# Patient Record
Sex: Male | Born: 1970 | Race: White | Hispanic: No | Marital: Single | Smoking: Never smoker
Health system: Southern US, Community
[De-identification: ages and names within clinical notes are randomized; demographics above are authoritative.]

## PROBLEM LIST (undated history)

## (undated) DIAGNOSIS — I10 Essential (primary) hypertension: Secondary | ICD-10-CM

## (undated) DIAGNOSIS — G4730 Sleep apnea, unspecified: Secondary | ICD-10-CM

## (undated) DIAGNOSIS — D561 Beta thalassemia: Secondary | ICD-10-CM

## (undated) DIAGNOSIS — K219 Gastro-esophageal reflux disease without esophagitis: Secondary | ICD-10-CM

## (undated) DIAGNOSIS — J439 Emphysema, unspecified: Secondary | ICD-10-CM

## (undated) DIAGNOSIS — E119 Type 2 diabetes mellitus without complications: Secondary | ICD-10-CM

## (undated) DIAGNOSIS — J159 Unspecified bacterial pneumonia: Secondary | ICD-10-CM

## (undated) DIAGNOSIS — D649 Anemia, unspecified: Secondary | ICD-10-CM

## (undated) DIAGNOSIS — J45909 Unspecified asthma, uncomplicated: Secondary | ICD-10-CM

## (undated) HISTORY — PX: APPENDECTOMY: SHX54

## (undated) HISTORY — PX: UPPER GI ENDOSCOPY: SHX6162

## (undated) HISTORY — PX: OTHER SURGICAL HISTORY: SHX169

---

## 2010-08-16 ENCOUNTER — Emergency Department: Payer: Self-pay | Admitting: Internal Medicine

## 2011-11-27 ENCOUNTER — Ambulatory Visit: Payer: Self-pay | Admitting: Family Medicine

## 2012-01-26 ENCOUNTER — Ambulatory Visit: Payer: Self-pay | Admitting: Gastroenterology

## 2012-02-01 LAB — PATHOLOGY REPORT

## 2012-05-31 DIAGNOSIS — J159 Unspecified bacterial pneumonia: Secondary | ICD-10-CM

## 2012-05-31 HISTORY — DX: Unspecified bacterial pneumonia: J15.9

## 2013-07-23 ENCOUNTER — Inpatient Hospital Stay: Payer: Self-pay | Admitting: Internal Medicine

## 2013-07-23 LAB — BASIC METABOLIC PANEL
Anion Gap: 12 (ref 7–16)
BUN: 13 mg/dL (ref 7–18)
CHLORIDE: 104 mmol/L (ref 98–107)
CREATININE: 1.27 mg/dL (ref 0.60–1.30)
Calcium, Total: 8.6 mg/dL (ref 8.5–10.1)
Co2: 20 mmol/L — ABNORMAL LOW (ref 21–32)
EGFR (Non-African Amer.): >60
Glucose: 161 mg/dL — ABNORMAL HIGH (ref 65–99)
Osmolality: 276 (ref 275–301)
POTASSIUM: 3.1 mmol/L — AB (ref 3.5–5.1)
SODIUM: 136 mmol/L (ref 136–145)

## 2013-07-23 LAB — GLUCOSE, SEROUS FLUID

## 2013-07-23 LAB — BODY FLUID CELL COUNT WITH DIFFERENTIAL
Basophil: 0 %
EOS PCT: 0 %
LYMPHS PCT: 2 %
NEUTROS PCT: 94 %
NUCLEATED CELL COUNT: 153708 /mm3
OTHER MONONUCLEAR CELLS: 4 %
Other Cells BF: 0 %

## 2013-07-23 LAB — LACTATE DEHYDROGENASE, PLEURAL OR PERITONEAL FLUID: LDH, Body Fluid: 1071 U/L

## 2013-07-23 LAB — AMYLASE, BODY FLUID: Amylase, Body Fluid: 17 U/L

## 2013-07-23 LAB — CBC
HCT: 33.1 % — ABNORMAL LOW (ref 40.0–52.0)
HGB: 9.2 g/dL — AB (ref 13.0–18.0)
MCH: 15.9 pg — ABNORMAL LOW (ref 26.0–34.0)
MCHC: 27.7 g/dL — ABNORMAL LOW (ref 32.0–36.0)
MCV: 57 fL — AB (ref 80–100)
PLATELETS: 303 10*3/uL (ref 150–440)
RBC: 5.78 10*6/uL (ref 4.40–5.90)
RDW: 20.8 % — AB (ref 11.5–14.5)
WBC: 15.2 10*3/uL — ABNORMAL HIGH (ref 3.8–10.6)

## 2013-07-23 LAB — TROPONIN I

## 2013-07-23 LAB — ALBUMIN, FLUID (OTHER): Body Fluid Albumin: 2.3 g/dL

## 2013-07-23 LAB — PROTEIN, BODY FLUID: Protein, Body Fluid: 5.8 g/dL

## 2013-07-23 LAB — T4, FREE: FREE THYROXINE: 1.15 ng/dL (ref 0.76–1.46)

## 2013-07-23 LAB — TSH: Thyroid Stimulating Horm: 0.849 u[IU]/mL

## 2013-07-24 LAB — CBC WITH DIFFERENTIAL/PLATELET
Basophil #: 0.1 10*3/uL (ref 0.0–0.1)
Basophil %: 0.3 %
Eosinophil #: 0.1 10*3/uL (ref 0.0–0.7)
Eosinophil %: 0.6 %
HCT: 30.1 % — ABNORMAL LOW (ref 40.0–52.0)
HGB: 8.5 g/dL — ABNORMAL LOW (ref 13.0–18.0)
LYMPHS ABS: 1.1 10*3/uL (ref 1.0–3.6)
Lymphocyte %: 5.5 %
MCH: 16.3 pg — ABNORMAL LOW (ref 26.0–34.0)
MCHC: 28.4 g/dL — ABNORMAL LOW (ref 32.0–36.0)
MCV: 58 fL — ABNORMAL LOW (ref 80–100)
MONOS PCT: 9.1 %
Monocyte #: 1.8 x10 3/mm — ABNORMAL HIGH (ref 0.2–1.0)
NEUTROS PCT: 84.5 %
Neutrophil #: 16.5 10*3/uL — ABNORMAL HIGH (ref 1.4–6.5)
Platelet: 228 10*3/uL (ref 150–440)
RBC: 5.23 10*6/uL (ref 4.40–5.90)
RDW: 20.8 % — AB (ref 11.5–14.5)
WBC: 19.6 10*3/uL — ABNORMAL HIGH (ref 3.8–10.6)

## 2013-07-24 LAB — COMPREHENSIVE METABOLIC PANEL
ALBUMIN: 2 g/dL — AB (ref 3.4–5.0)
ALK PHOS: 54 U/L
AST: 11 U/L — AB (ref 15–37)
Anion Gap: 5 — ABNORMAL LOW (ref 7–16)
BILIRUBIN TOTAL: 0.3 mg/dL (ref 0.2–1.0)
BUN: 9 mg/dL (ref 7–18)
CHLORIDE: 101 mmol/L (ref 98–107)
CO2: 28 mmol/L (ref 21–32)
CREATININE: 0.85 mg/dL (ref 0.60–1.30)
Calcium, Total: 8 mg/dL — ABNORMAL LOW (ref 8.5–10.1)
Glucose: 127 mg/dL — ABNORMAL HIGH (ref 65–99)
Osmolality: 269 (ref 275–301)
POTASSIUM: 4.1 mmol/L (ref 3.5–5.1)
SGPT (ALT): 15 U/L (ref 12–78)
SODIUM: 134 mmol/L — AB (ref 136–145)
Total Protein: 6.4 g/dL (ref 6.4–8.2)

## 2013-07-24 LAB — MAGNESIUM: Magnesium: 1.9 mg/dL

## 2013-07-24 LAB — APTT: ACTIVATED PTT: 44.9 s — AB (ref 23.6–35.9)

## 2013-07-24 LAB — PROTIME-INR
INR: 1.4
Prothrombin Time: 16.6 secs — ABNORMAL HIGH (ref 11.5–14.7)

## 2013-07-25 DIAGNOSIS — I4891 Unspecified atrial fibrillation: Secondary | ICD-10-CM

## 2013-07-25 LAB — CBC WITH DIFFERENTIAL/PLATELET
BASOS PCT: 0.3 %
Basophil #: 0.1 10*3/uL (ref 0.0–0.1)
EOS PCT: 0.4 %
Eosinophil #: 0.1 10*3/uL (ref 0.0–0.7)
HCT: 29.5 % — ABNORMAL LOW (ref 40.0–52.0)
HGB: 8.4 g/dL — ABNORMAL LOW (ref 13.0–18.0)
Lymphocyte #: 1.1 10*3/uL (ref 1.0–3.6)
Lymphocyte %: 5 %
MCH: 16.4 pg — ABNORMAL LOW (ref 26.0–34.0)
MCHC: 28.5 g/dL — AB (ref 32.0–36.0)
MCV: 58 fL — ABNORMAL LOW (ref 80–100)
MONO ABS: 1.4 x10 3/mm — AB (ref 0.2–1.0)
MONOS PCT: 6.1 %
NEUTROS PCT: 88.2 %
Neutrophil #: 19.9 10*3/uL — ABNORMAL HIGH (ref 1.4–6.5)
Platelet: 238 10*3/uL (ref 150–440)
RBC: 5.14 10*6/uL (ref 4.40–5.90)
RDW: 20.6 % — ABNORMAL HIGH (ref 11.5–14.5)
WBC: 22.6 10*3/uL — ABNORMAL HIGH (ref 3.8–10.6)

## 2013-07-26 LAB — BASIC METABOLIC PANEL
Anion Gap: 4 — ABNORMAL LOW (ref 7–16)
BUN: 7 mg/dL (ref 7–18)
CALCIUM: 7.8 mg/dL — AB (ref 8.5–10.1)
CHLORIDE: 102 mmol/L (ref 98–107)
Co2: 27 mmol/L (ref 21–32)
Creatinine: 0.76 mg/dL (ref 0.60–1.30)
EGFR (African American): >60
GLUCOSE: 127 mg/dL — AB (ref 65–99)
OSMOLALITY: 266 (ref 275–301)
Potassium: 4.2 mmol/L (ref 3.5–5.1)
SODIUM: 133 mmol/L — AB (ref 136–145)

## 2013-07-26 LAB — CBC WITH DIFFERENTIAL/PLATELET
Basophil: 1 %
HCT: 31.3 % — ABNORMAL LOW (ref 40.0–52.0)
HGB: 9 g/dL — ABNORMAL LOW (ref 13.0–18.0)
Lymphocytes: 14 %
MCH: 16.5 pg — ABNORMAL LOW (ref 26.0–34.0)
MCHC: 28.7 g/dL — AB (ref 32.0–36.0)
MCV: 58 fL — ABNORMAL LOW (ref 80–100)
Monocytes: 4 %
Platelet: 286 10*3/uL (ref 150–440)
RBC: 5.43 10*6/uL (ref 4.40–5.90)
RDW: 21.1 % — ABNORMAL HIGH (ref 11.5–14.5)
Segmented Neutrophils: 81 %
WBC: 11 10*3/uL — ABNORMAL HIGH (ref 3.8–10.6)

## 2013-07-27 LAB — CREATININE, SERUM
CREATININE: 0.71 mg/dL (ref 0.60–1.30)
EGFR (Non-African Amer.): >60

## 2013-07-27 LAB — RAPID HIV-1/2 QL/CONFIRM: HIV-1/2,Rapid Ql: NEGATIVE

## 2013-07-27 LAB — EXPECTORATED SPUTUM ASSESSMENT W GRAM STAIN, RFLX TO RESP C

## 2013-07-27 LAB — BODY FLUID CULTURE

## 2013-07-28 LAB — CULTURE, BLOOD (SINGLE)

## 2013-07-28 LAB — VANCOMYCIN, TROUGH: Vancomycin, Trough: 9 ug/mL — ABNORMAL LOW (ref 10–20)

## 2013-07-29 LAB — CREATININE, SERUM
Creatinine: 0.68 mg/dL (ref 0.60–1.30)
EGFR (African American): >60
EGFR (Non-African Amer.): >60

## 2013-07-29 LAB — VANCOMYCIN, TROUGH: VANCOMYCIN, TROUGH: 9 ug/mL — AB (ref 10–20)

## 2013-07-30 LAB — VANCOMYCIN, TROUGH: Vancomycin, Trough: 17 ug/mL (ref 10–20)

## 2013-07-30 LAB — CBC WITH DIFFERENTIAL/PLATELET
BASOS ABS: 0 10*3/uL (ref 0.0–0.1)
Basophil %: 0.5 %
Eosinophil #: 0.4 10*3/uL (ref 0.0–0.7)
Eosinophil %: 3.6 %
HCT: 25.6 % — AB (ref 40.0–52.0)
HGB: 7.4 g/dL — ABNORMAL LOW (ref 13.0–18.0)
Lymphocyte #: 1 10*3/uL (ref 1.0–3.6)
Lymphocyte %: 10.1 %
MCH: 16.3 pg — AB (ref 26.0–34.0)
MCHC: 28.8 g/dL — ABNORMAL LOW (ref 32.0–36.0)
MCV: 57 fL — AB (ref 80–100)
MONOS PCT: 9.2 %
Monocyte #: 0.9 x10 3/mm (ref 0.2–1.0)
Neutrophil #: 7.7 10*3/uL — ABNORMAL HIGH (ref 1.4–6.5)
Neutrophil %: 76.6 %
PLATELETS: 285 10*3/uL (ref 150–440)
RBC: 4.54 10*6/uL (ref 4.40–5.90)
RDW: 20.6 % — AB (ref 11.5–14.5)
WBC: 10 10*3/uL (ref 3.8–10.6)

## 2013-07-30 LAB — WOUND CULTURE

## 2013-07-30 LAB — CREATININE, SERUM
Creatinine: 0.64 mg/dL (ref 0.60–1.30)
EGFR (Non-African Amer.): >60

## 2013-07-31 LAB — CREATININE, SERUM
Creatinine: 0.7 mg/dL (ref 0.60–1.30)
EGFR (African American): >60
EGFR (Non-African Amer.): >60

## 2013-07-31 LAB — VANCOMYCIN, TROUGH: Vancomycin, Trough: 22 ug/mL (ref 10–20)

## 2013-07-31 LAB — PATHOLOGY REPORT

## 2013-08-01 LAB — VANCOMYCIN, TROUGH: Vancomycin, Trough: 29 ug/mL (ref 10–20)

## 2013-08-01 LAB — EXPECTORATED SPUTUM ASSESSMENT W REFEX TO RESP CULTURE

## 2013-08-08 ENCOUNTER — Ambulatory Visit: Payer: Self-pay | Admitting: Cardiothoracic Surgery

## 2013-08-09 ENCOUNTER — Ambulatory Visit: Payer: Self-pay | Admitting: Cardiothoracic Surgery

## 2013-08-23 ENCOUNTER — Ambulatory Visit: Payer: Self-pay | Admitting: Cardiothoracic Surgery

## 2013-08-29 ENCOUNTER — Ambulatory Visit: Payer: Self-pay | Admitting: Cardiothoracic Surgery

## 2013-09-20 LAB — CLOSTRIDIUM DIFFICILE(ARMC)

## 2013-09-28 ENCOUNTER — Ambulatory Visit: Payer: Self-pay | Admitting: Cardiothoracic Surgery

## 2014-09-21 NOTE — Consult Note (Signed)
Allergies:  No Known Allergies:   Assessment/Plan:  Assessment/Plan Pt seen in consult for left-sided thyroid nodules. Pt was seen, examined,, and chart reviewed. US neck shows 2 eft-sided nodules 2.6 and 1.5 cm.  A/ Thyroid nodules  P/ Check TSH level Anticipate out-pt follow-up for US guided FNA of the thyroid nodules.  Full consult will be dictated.   Electronic Signatures: Raj JanusSolum, Anna M (MD)  (Signed 23-Feb-15 17:48)  AuthoredForrestine Him: ALLERGIES, Assessment/Plan   Last Updated: 23-Feb-15 17:48 by Raj JanusSolum, Anna M (MD)

## 2014-09-21 NOTE — Consult Note (Signed)
Brief Consult Note: Diagnosis: severe heartburn, abnormal CT with thickened esophagus.   Patient was seen by consultant.   Consult note dictated.   Recommend further assessment or treatment.   Orders entered.   Comments: Please see full GI consult [phone]#[id number].  Patient admitted with pneumonia, found with abnormal ct showing thickened esophagus.  CT reviewed, however appearance more c/w severe esophagitis than mass.  Long h/o GERD with irregular treatment. Currently due to pulmonary status, not a candidate for sedated luminal evaluation.  However will change ppi to iv bid, also will need to get stool for c+s/c.diff due to c/o diarrhea and  recent abx history.  Following.  Electronic Signatures: Barnetta ChapelSkulskie, Martin (MD)  (Signed 23-Feb-15 21:37)  Authored: Brief Consult Note   Last Updated: 23-Feb-15 21:37 by Barnetta ChapelSkulskie, Martin (MD)

## 2014-09-21 NOTE — Consult Note (Signed)
Brief Consult Note: Diagnosis: left pleural effusion.   Patient was seen by consultant.   Consult note dictated.   Comments: Discussed care with Drs. Kerby LessPatel, Kasa, Archer AsaMcCullough.  Loculated pleural effusion.  LLL pneumonia, esophageal thickening, marked mediastinal adenopathy, thyroid mass, microcytic anemia.  Recommend CT guided pigtail with TPA for pleural effusion.  Discussed option of VATS or thoracotomy as well.  Will try to manage nonoperatively.  Electronic Signatures: Jasmine Decemberaks, Deziah Renwick E (MD)  (Signed 24-Feb-15 13:19)  Authored: Brief Consult Note   Last Updated: 24-Feb-15 13:19 by Jasmine Decemberaks, Tabby Beaston E (MD)

## 2014-09-21 NOTE — Consult Note (Signed)
Chief Complaint:  Subjective/Chief Complaint Coverng for Dr. Marva PandaSkulskie. No dysphagia. No heartburn now- on protonix. Eating well. Has chest tube in place.   VITAL SIGNS/ANCILLARY NOTES: **Vital Signs.:   28-Feb-15 05:14  Vital Signs Type Routine  Temperature Temperature (F) 99.3  Celsius 37.3  Temperature Source oral  Pulse Pulse 105  Respirations Respirations 18  Systolic BP Systolic BP 117  Diastolic BP (mmHg) Diastolic BP (mmHg) 76  Mean BP 89  Pulse Ox % Pulse Ox % 95  Pulse Ox Activity Level  At rest  Oxygen Delivery 2L   Brief Assessment:  GEN no acute distress   Cardiac Regular   Respiratory clear BS  left chest tube   Gastrointestinal Normal   Lab Results: TDMs:  28-Feb-15 23:52   Vancomycin, Trough LAB  9 (Result(s) reported on 28 Jul 2013 at 12:37AM.)   Assessment/Plan:  Assessment/Plan:  Assessment Pneumonia/empyema. Thickened esophagus. Prob esophagitis.   Plan Continue protonix. Can give po bid. Dr. Marva PandaSkulskie will check on patient Monday.   Electronic Signatures: Lutricia Feilh, Yui Mulvaney (MD)  (Signed [phone]-Feb-15 10:12)  Authored: Chief Complaint, VITAL SIGNS/ANCILLARY NOTES, Brief Assessment, Lab Results, Assessment/Plan   Last Updated: 28-Feb-15 10:12 by Lutricia Feilh, Yoali Conry (MD)

## 2014-09-21 NOTE — Consult Note (Signed)
Chief Complaint:  Subjective/Chief Complaint seen for abnormal chest CT/thickened esophagus. one episode of heartburn since yesterday   VITAL SIGNS/ANCILLARY NOTES: **Vital Signs.:   24-Feb-15 17:13  Temperature Source oral  Pulse Pulse 118  Pulse source if not from Vital Sign Device ST  Respirations Respirations 39  Systolic BP Systolic BP 347  Diastolic BP (mmHg) Diastolic BP (mmHg) 83  Mean BP 101  Pulse Ox % Pulse Ox % 91  Pulse Ox Activity Level  At rest  Oxygen Delivery 3L; Nasal Cannula  Pulse Ox Heart Rate 118  Telemetry pattern Cardiac Rhythm Atrial fibrillation; pattern reported by Telemetry Clerk    17:30  Vital Signs Type Routine  Temperature Temperature (F) 102.8  Celsius 39.3  Temperature Source oral  Pulse Pulse 114  Pulse source if not from Vital Sign Device ST  Respirations Respirations 41  Systolic BP Systolic BP 90  Diastolic BP (mmHg) Diastolic BP (mmHg) 81  Mean BP 84  Pulse Ox % Pulse Ox % 92  Pulse Ox Activity Level  At rest  Oxygen Delivery 3L; Nasal Cannula  Pulse Ox Heart Rate 116  Telemetry pattern Cardiac Rhythm Atrial fibrillation; pattern reported by Telemetry Clerk   Brief Assessment:  Cardiac tachycardia   Respiratory clear BS  more somewhat less on left   Gastrointestinal details normal Soft  Nontender  Nondistended  Bowel sounds normal   Lab Results: Hepatic:  24-Feb-15 04:51   Bilirubin, Total 0.3  Alkaline Phosphatase 54 (45-117 NOTE: New Reference Range 04/20/13)  SGPT (ALT) 15  SGOT (AST)  11  Total Protein, Serum 6.4  Albumin, Serum  2.0  Routine BB:  24-Feb-15 12:17   ABO Group + Rh Type O Positive  Antibody Screen NEGATIVE (Result(s) reported on 24 Jul 2013 at 01:30PM.)  Crossmatch Unit 1 Ready  Crossmatch Unit 2 Ready  Crossmatch Unit 3 Ready  Crossmatch Unit 4 Ready (Result(s) reported on 24 Jul 2013 at 02:07PM.)  Cardiology:  24-Feb-15 12:05   Ventricular Rate 199  Atrial Rate 136  QRS Duration 68  QT 220   QTc 400  R Axis 3  T Axis 66  ECG interpretation Atrial fibrillation with rapid ventricular response with premature ventricular or aberrantly conducted complexes Nonspecific ST and T wave abnormality , probably digitalis effect Abnormal ECG When compared with ECG of 23-Jul-2013 02:08, Atrial fibrillation has replaced Sinus rhythm Vent. rate has increased BY 100 BPM Nonspecific T wave abnormality now evident in Inferior leads Confirmed by MORAYATI, SAM ([id number]) on 07/24/2013 4:02:25 PM  Overreader: Hipolito Bayley  Routine Chem:  24-Feb-15 04:51   Result Comment HGB/HCT - RESULTS VERIFIED BY REPEAT TESTING.  Result(s) reported on 24 Jul 2013 at 05:26AM.  Glucose, Serum  127  BUN 9  Creatinine (comp) 0.85  Sodium, Serum  134  Potassium, Serum 4.1  Chloride, Serum 101  CO2, Serum 28  Calcium (Total), Serum  8.0  Osmolality (calc) 269  eGFR (African American) >60  eGFR (Non-African American) >60 (eGFR values <24m/min/1.73 m2 may be an indication of chronic kidney disease (CKD). Calculated eGFR is useful in patients with stable renal function. The eGFR calculation will not be reliable in acutely ill patients when serum creatinine is changing rapidly. It is not useful in  patients on dialysis. The eGFR calculation may not be applicable to patients at the low and high extremes of body sizes, pregnant women, and vegetarians.)  Anion Gap  5  Magnesium, Serum 1.9 (1.8-2.4 THERAPEUTIC RANGE: 4-7 mg/dL TOXIC: > 10  mg/dL  -----------------------)  Routine Coag:  24-Feb-15 12:17   Prothrombin  16.6  INR 1.4 (INR reference interval applies to patients on anticoagulant therapy. A single INR therapeutic range for coumarins is not optimal for all indications; however, the suggested range for most indications is 2.0 - 3.0. Exceptions to the INR Reference Range may include: Prosthetic heart valves, acute myocardial infarction, prevention of myocardial infarction, and combinations of aspirin  and anticoagulant. The need for a higher or lower target INR must be assessed individually. Reference: The Pharmacology and Management of the Vitamin K  antagonists: the seventh ACCP Conference on Antithrombotic and Thrombolytic Therapy. XVQMG.8676 Sept:126 (3suppl): N9146842. A HCT value >55% may artifactually increase the PT.  In one study,  the increase was an average of 25%. Reference:  "Effect on Routine and Special Coagulation Testing Values of Citrate Anticoagulant Adjustment in Patients with High HCT Values." American Journal of Clinical Pathology 2006;126:400-405.)  Activated PTT (APTT)  44.9 (A HCT value >55% may artifactually increase the APTT. In one study, the increase was an average of 19%. Reference: "Effect on Routine and Special Coagulation Testing Values of Citrate Anticoagulant Adjustment in Patients with High HCT Values." American Journal of Clinical Pathology 2006;126:400-405.)  Routine Hem:  23-Feb-15 02:05   WBC (CBC)  15.2  24-Feb-15 04:51   WBC (CBC)  19.6  RBC (CBC) 5.23  Hemoglobin (CBC)  8.5  Hematocrit (CBC)  30.1  Platelet Count (CBC) 228  MCV  58  MCH  16.3  MCHC  28.4  RDW  20.8  Neutrophil % 84.5  Lymphocyte % 5.5  Monocyte % 9.1  Eosinophil % 0.6  Basophil % 0.3  Neutrophil #  16.5  Lymphocyte # 1.1  Monocyte #  1.8  Eosinophil # 0.1  Basophil # 0.1   Radiology Results: XRay:    24-Feb-15 17:00, Chest 1 View AP or PA  Chest 1 View AP or PA   REASON FOR EXAM:    post chest tube insertion  COMMENTS:       PROCEDURE: DXR - DXR CHEST 1 VIEWAP OR PA  - Jul 24 2013  5:00PM     CLINICAL DATA:  Post chest tube insertion    EXAM:  CHEST - 1 VIEW    COMPARISON:  07/23/2013    FINDINGS:  Cardiomegaly again noted. Right lung is clear. A pleural catheter is  noted in left upper hemithorax. Small residual partially loculated  left pleural effusion with left basilar atelectasis or infiltrate.  No diagnostic pneumothorax. Some atelectasis  or scarring is noted in  left apex.     IMPRESSION:  Left pleural catheter is noted in upper hemithorax. No diagnostic  pneumothorax. Small partially loculated left pleural effusion with  left basilar atelectasis or infiltrate.      Electronically Signed    PP:JKDTO  Pop M.D.    On: 07/24/2013 17:04       Verified By: Ephraim Hamburger, M.D.,  Cardiology:    24-Feb-15 12:05, ECG  ECG interpretation   Atrial fibrillation with rapid ventricular response with premature ventricular or aberrantly conducted complexes  Nonspecific ST and T wave abnormality , probably digitalis effect  Abnormal ECG  When compared with ECG of 23-Jul-2013 02:08,  Atrial fibrillation has replaced Sinus rhythm  Vent. rate has increased BY 100 BPM  Nonspecific T wave abnormality now evident in Inferior leads  Confirmed by MORAYATI, SAM ([id number]) on 07/24/2013 4:02:25 PM    Overreader: Hipolito Bayley   Assessment/Plan:  Assessment/Plan:  Assessment 1) abnormal ct with much of the esophagus from the mid to bottom thickened.  esophagitis vs neoplasia (less likely) 2) left pleural effusion/recent pna-management per Dr Faith Rogue. 3) new AF-converted spontaneously, stable.   Plan 1) continue iv bid ppi. 2) egd when clinically feasible.  If patient goes to OR thursday pm, an be done prior to extubation in the OR.  Otherwise will need to wait until clinical improvement otherwise.  discussed with Dr Faith Rogue.   Electronic Signatures: Loistine Simas (MD)  (Signed 24-Feb-15 18:05)  Authored: Chief Complaint, VITAL SIGNS/ANCILLARY NOTES, Brief Assessment, Lab Results, Radiology Results, Assessment/Plan   Last Updated: 24-Feb-15 18:05 by Loistine Simas (MD)

## 2014-09-21 NOTE — Consult Note (Signed)
Chief Complaint:  Subjective/Chief Complaint seen for abnormal ct, thickened esophagus.  patietn doing well on bid ppi.  no n/v no dysphagia, no abd pain.  tolerating regualr diet.  No constipation currently not taking miralax.   VITAL SIGNS/ANCILLARY NOTES: **Vital Signs.:   05-Mar-15 05:33  Temperature Temperature (F) 98.1  Celsius 36.7  Temperature Source oral  Pulse Pulse 84  Respirations Respirations 18  Systolic BP Systolic BP 113  Diastolic BP (mmHg) Diastolic BP (mmHg) 79  Mean BP 90  Pulse Ox % Pulse Ox % 92  Pulse Ox Activity Level  At rest  Oxygen Delivery Room Air/ 21 %   Brief Assessment:  Cardiac Regular   Respiratory clear BS  some decrease left compared to right.   Gastrointestinal details normal Soft  Nontender  Nondistended  Bowel sounds normal   Lab Results: TDMs:  04-Mar-15 15:40   Vancomycin, Trough LAB  29  Routine Chem:  04-Mar-15 15:40   Result Comment vancomycin - RESULTS VERIFIED BY REPEAT TESTING.  - NOTIFIED OF CRITICAL VALUE  - c/t jason robbins 1629 08-01-13 gas  - READ-BACK PROCESS PERFORMED.  Result(s) reported on 01 Aug 2013 at 04:33PM.  Routine Hem:  02-Mar-15 02:59   MCV  57   Radiology Results: XRay:    04-Mar-15 08:46, Chest PA and Lateral  Chest PA and Lateral   REASON FOR EXAM:    chest tubes to open drainage.  Please do inspiratory   film  COMMENTS:       PROCEDURE: DXR - DXR CHEST PA (OR AP) AND LATERAL  - Aug 01 2013  8:46AM     CLINICAL DATA:  Chest tube drainage.    EXAM:  CHEST  2 VIEW    COMPARISON:Same day.    FINDINGS:  Three left-sided chest tubes are again noted and unchanged in  position. No pneumothorax is noted. Left basilar opacity is again  noted consistent with subsegmental atelectasis or scarring. Right  lung is clear. Right-sided PICC line is unchanged in position.  Stable elevation of left hemidiaphragm. Minimally displaced left 6  rib fracture is again noted.     IMPRESSION:  Left-sided chest  tubes are unchanged in position. No pneumothorax is  noted. No significant change compared to prior exam.      Electronically Signed    By: Roque LiasJames  Green M.D.    On: 08/01/2013 08:54     Verified By: Maryagnes AmosJAMES J. GREEN, M.D.,   Assessment/Plan:  Assessment/Plan:  Assessment 1)  GERD-symptoms improved on bid protonix. 2) abnormal CT, thickened esophagus, as noted  3) PNA/pleural effusion, s/p drainage/chesrt tubes and decortication-stable.   Plan 1) EGD today.  I have discussed the risks benefits and complications of egd to include not limited to bleeding infection perforation and sedation and he wishes to proceed.  further recs to follow.   Electronic Signatures: Barnetta ChapelSkulskie, Breena Bevacqua (MD)  (Signed 05-Mar-15 11:05)  Authored: Chief Complaint, VITAL SIGNS/ANCILLARY NOTES, Brief Assessment, Lab Results, Radiology Results, Assessment/Plan   Last Updated: 05-Mar-15 11:05 by Barnetta ChapelSkulskie, Delaine Hernandez (MD)

## 2014-09-21 NOTE — Consult Note (Signed)
Chief Complaint:  Subjective/Chief Complaint seen for gerd and abnormal CT scan.  denies n/v dysphagia or abdominal pain. toleratign regular diet very well.   VITAL SIGNS/ANCILLARY NOTES: **Vital Signs.:   04-Mar-15 05:24  Temperature Temperature (F) 97.9  Celsius 36.6  Temperature Source oral  Pulse Pulse 94  Respirations Respirations 18  Systolic BP Systolic BP 673  Diastolic BP (mmHg) Diastolic BP (mmHg) 83  Mean BP 95  Pulse Ox % Pulse Ox % 93  Pulse Ox Activity Level  At rest  Oxygen Delivery Room Air/ 21 %   Brief Assessment:  GEN well developed   Cardiac Regular   Respiratory clear BS   Gastrointestinal details normal Soft  Nontender  Nondistended  No masses palpable  Bowel sounds normal   Lab Results: TDMs:  03-Mar-15 05:27   Vancomycin, Trough LAB  22  Routine Chem:  03-Mar-15 05:27   Result Comment LABS - This specimen was collected through an   - indwelling catheter or arterial line.  - A minimum of 32ms of blood was wasted prior    - to collecting the sample.  Interpret  - results with caution. VANCOMYCIN - RESULTS VERIFIED BY REPEAT TESTING.  - NOTIFIED OF CRITICAL VALUE  - C/ NIKOLA [id number]  07-31-13. [facility]  - READ-BACK PROCESS PERFORMED.  Result(s) reported on 31 Jul 2013 at 06:41AM.  Creatinine (comp) 0.70  eGFR (African American) >60  eGFR (Non-African American) >60 (eGFR values <677mmin/1.73 m2 may be an indication of chronic kidney disease (CKD). Calculated eGFR is useful in patients with stable renal function. The eGFR calculation will not be reliable in acutely ill patients when serum creatinine is changing rapidly. It is not useful in  patients on dialysis. The eGFR calculation may not be applicable to patients at the low and high extremes of body sizes, pregnant women, and vegetarians.)   Assessment/Plan:  Assessment/Plan:  Assessment 1) abnormal CT with thickened esophagus.  patient currently asymptomatic, taking bid ppi regularly.   I did recommend a barium swallow, however radiology feels the films will not be optimal due to  changes in the left lung and chest tubes.  alternative is to do egd with anesthesia assistance for further evaluation.  It will be 6 weeks before the chest tubes are removed.  case discussed with Dr OaFaith Roguend Dr KoVianne Bulls  Plan 1) egd tomorrow.  I have discussed the risks benefits and complications of egd to include not limited to bleeding infection perforation and sedation and he wishes to proceed. further recs to follow.   Electronic Signatures: SkLoistine SimasMD)  (Signed 04-Mar-15 12:26)  Authored: Chief Complaint, VITAL SIGNS/ANCILLARY NOTES, Brief Assessment, Lab Results, Assessment/Plan   Last Updated: 04-Mar-15 12:26 by SkLoistine SimasMD)

## 2014-09-21 NOTE — Consult Note (Signed)
Chief Complaint:  Subjective/Chief Complaint seen for abnormal ct, GERD.  patietn denies reflux or heartburn sx today, tolerating regular diet, denies abdominal pain or dysphagia.   VITAL SIGNS/ANCILLARY NOTES: **Vital Signs.:   25-Feb-15 07:00  Vital Signs Type Routine  Temperature Temperature (F) 97.6  Celsius 36.4  Temperature Source oral  Pulse Pulse 92  Pulse source if not from Vital Sign Device per cardiac monitor  Respirations Respirations 32  Systolic BP Systolic BP 126  Diastolic BP (mmHg) Diastolic BP (mmHg) 69  Mean BP 88  Pulse Ox % Pulse Ox % 97  Oxygen Delivery 5L; Nasal Cannula  Pulse Ox Heart Rate 92    12:00  Vital Signs Type Routine  Pulse Pulse 100  Pulse source if not from Vital Sign Device per cardiac monitor  Respirations Respirations 32  Systolic BP Systolic BP 101  Diastolic BP (mmHg) Diastolic BP (mmHg) 73  Mean BP 82  Pulse Ox % Pulse Ox % 94  Oxygen Delivery 5L; Nasal Cannula  Pulse Ox Heart Rate 100   Brief Assessment:  Cardiac Regular   Respiratory normal resp effort  clear right, clear left   Gastrointestinal details normal Soft  Nontender  Nondistended  Bowel sounds normal   Lab Results: Routine Coag:  24-Feb-15 12:17   INR 1.4 (INR reference interval applies to patients on anticoagulant therapy. A single INR therapeutic range for coumarins is not optimal for all indications; however, the suggested range for most indications is 2.0 - 3.0. Exceptions to the INR Reference Range may include: Prosthetic heart valves, acute myocardial infarction, prevention of myocardial infarction, and combinations of aspirin and anticoagulant. The need for a higher or lower target INR must be assessed individually. Reference: The Pharmacology and Management of the Vitamin K  antagonists: the seventh ACCP Conference on Antithrombotic and Thrombolytic Therapy. Chest.2004 Sept:126 (3suppl): L7870634. A HCT value >55% may artifactually increase the PT.   In one study,  the increase was an average of 25%. Reference:  "Effect on Routine and Special Coagulation Testing Values of Citrate Anticoagulant Adjustment in Patients with High HCT Values." American Journal of Clinical Pathology 2006;126:400-405.)  Routine Hem:  24-Feb-15 04:51   WBC (CBC)  19.6  Platelet Count (CBC) 228  25-Feb-15 07:26   WBC (CBC)  22.6  RBC (CBC) 5.14  Hemoglobin (CBC)  8.4  Hematocrit (CBC)  29.5  Platelet Count (CBC) 238  MCV  58  MCH  16.4  MCHC  28.5  RDW  20.6  Neutrophil % 88.2  Lymphocyte % 5.0  Monocyte % 6.1  Eosinophil % 0.4  Basophil % 0.3  Neutrophil #  19.9  Lymphocyte # 1.1  Monocyte #  1.4  Eosinophil # 0.1  Basophil # 0.1 (Result(s) reported on 25 Jul 2013 at 07:45AM.)   Radiology Results: XRay:    25-Feb-15 06:17, Chest Portable Single View  Chest Portable Single View   REASON FOR EXAM:    assess for pleural effusion  COMMENTS:       PROCEDURE: DXR - DXR PORTABLE CHEST SINGLE VIEW  - Jul 25 2013  6:17AM     CLINICAL DATA:  Pleural effusion.    EXAM:  PORTABLE CHEST - 1 VIEW    COMPARISON:  DG CHEST 1V PORT dated 07/24/2013    FINDINGS:  Mediastinum and hilar structures normal. Persistent cardiomegaly is  present with mild pulmonary vascular prominence. Diffuse left lung  infiltrate has partially cleared. Left pleural effusion remains. No  pneumothorax. No acute osseous abnormality.  IMPRESSION:  1. Partial clearing of left lung infiltrate and/or atelectasis.  2. Persistent left pleural effusion.  3. Persistent severe cardiomegaly with mild pulmonary vascular  prominence .      Electronically Signed    By: Maisie Fushomas  Register    On: 07/25/2013 07:40       Verified By: Gwynn BurlyHOMAS E. REGISTER, M.D., MD   Assessment/Plan:  Assessment/Plan:  Assessment 1) abnormal ct with apparent thickened esophagus.  no dysphagia, tolerating regular diet, no heartburn curently on bid iv ppi.  2)  PNA, left empyema/effusion-improving   3) cardiomegally   Plan 1)  continue bid ppi.  needs to continue bid protonix as outpatient. Possible EGD tomorrow if he is intubated for OR proceedure.  Otherwise will need to await further improvement of pulmonary status to do as outpatient in a couple weeks.   Electronic Signatures: Barnetta ChapelSkulskie, Martin (MD)  (Signed [phone]-Feb-15 13:22)  Authored: Chief Complaint, VITAL SIGNS/ANCILLARY NOTES, Brief Assessment, Lab Results, Radiology Results, Assessment/Plan   Last Updated: 25-Feb-15 13:22 by Barnetta ChapelSkulskie, Martin (MD)

## 2014-09-21 NOTE — Consult Note (Signed)
PATIENT NAME:  Evan, Edwards MR#:  [id number] DATE OF BIRTH:  1970-12-12  DATE OF CONSULTATION:  07/27/2013  REFERRING PHYSICIAN:  Sona A. Allena Katz, MD CONSULTING PHYSICIAN:  Stann Mainland. Sampson Goon, MD  REASON FOR CONSULTATION: Empyema.   HISTORY OF PRESENT ILLNESS: This is a previously healthy 44 year old gentleman who works as a Naval architect, who was admitted with SIRS and left lower lobe pneumonia as well as a complicated pleural effusion that ended up being an empyema. The patient describes that about 1 month ago, he was traveling in [place], had a bad pneumonia, was admitted to the hospital and treated with antibiotics. He improved, was discharged home and started on levofloxacin. However, he then developed worsening chest pain, cough and fevers. He was admitted and underwent a left-sided thoracentesis on the 23rd. He then had pigtail catheter placement on February 24. He has been receiving tPA through the pigtail catheter. Followup CT does show loculations, and so he underwent open thoracotomy with a chest tube placement and decortication on February 26. Cultures of the fluid are negative so far. Cultures of the sputum are growing Serratia. Blood cultures are negative.   The patient reports he has never had pneumonia before. Prior to that most recent pneumonia, he was doing relatively well. He reports having an HIV test 4 to 5 years ago.   PAST MEDICAL HISTORY: None.   PAST SURGICAL HISTORY: Appendectomy.   ALLERGIES: No known drug allergies.   SOCIAL HISTORY: He is a Naval architect. He lives at home alone. Does not smoke. He is single. He drinks alcohol only very occasionally. He does not use street drugs.   FAMILY HISTORY: Positive for coronary artery disease.   MEDICATIONS: No home medications.  ANTIBIOTICS SINCE ADMISSION: Include meropenem and vancomycin.   REVIEW OF SYSTEMS: Eleven systems are negative except as per HPI.   PHYSICAL EXAMINATION:  VITAL SIGNS: Temperature  98.2, pulse 96, blood pressure 132/78, respirations 24, sat 98% on 5 liters.  GENERAL: He is interactive but somewhat agitated and speaks in a very loud voice and is somewhat irritated.  HEENT: Pupils equal, round and reactive to light and accommodation. Extraocular movements are intact. Sclerae are anicteric. Oropharynx is clear.  NECK: Supple.  HEART: Regular.  LUNGS: He has coarse breath sounds on the left. There is a chest tube draining serosanguineous fluid.  ABDOMEN: Soft, nontender, nondistended.  EXTREMITIES: No clubbing, cyanosis or edema.  SKIN: No rashes noted.   DATA: Fluid cultures from the pleural fluid, February 23rd, are no growth. Blood cultures, February 23: No growth x2. Sputum culture is growing moderate Serratia sensitive to ceftriaxone, ciprofloxacin, gentamicin, ceftazidime and levofloxacin. It was resistant to cefazolin. Pleural cytology: Note, this was negative for malignant cells. Fluid pH was 6.4. Fluid had a total cell count of 153,000 with 94% neutrophils. Glucose is quite low at less than 5. LDH quite elevated at 1071, and protein elevated at 5.8. White blood count on admission was 15, peaked at 22 and is now down to 11 from yesterday. Hemoglobin is 9.8, platelets of 286.   IMAGING: CT of his chest done on February 23rd shows moderate left-sided pleural effusion with diffuse underlying interstitial prominence and relative dense patchy airspace opacity within the left lung. There is diffuse wall thickening along the mid esophagus. Scattered lymph nodes. There is a 4.2 cm left thyroid mass with scattered calcifications. Followup CT on February 26th showed the left chest tube in place. There is a persistent loculated pleural effusion at the  left base. There are new airspace opacities within the right lung with a central distribution, very small pericardial effusion.   IMPRESSION: A 44 year old gentleman with a history of only reflux esophagitis, who was admitted 1 month after  pneumonia with a severe empyema. He is now status post decortication. Cultures of his sputum are growing Serratia, but other cultures are negative. He just had a PICC line placed.   RECOMMENDATIONS:  1. We are going to check an HIV test. I obtained consent from the patient for this.  2. The patient has a PICC line in place. At this point, I would recommend continuing the vancomycin and meropenem given that he has been quite ill. Meropenem would cover the Serratia and also provide coverage for other pathogens. Maybe if MRSA is not uncovered, we can discontinue the vancomycin. He could probably be discharged on ertapenem for a 4- to 6-week course of IV antibiotics. Depending on recovery, he could then be transitioned to oral antibiotics, and I can see him in followup.   Thank you for the consult. I will be glad to follow with you.   ____________________________ Stann Mainlandavid P. Sampson GoonFitzgerald, MD dpf:lb D: 07/27/2013 13:54:54 ET T: 07/27/2013 14:25:58 ET JOB#: [id number]  cc: Stann Mainlandavid P. Sampson GoonFitzgerald, MD, <Dictator> DAVID Sampson GoonFITZGERALD MD ELECTRONICALLY SIGNED 08/06/2013 19:01

## 2014-09-21 NOTE — Op Note (Signed)
PATIENT NAME:  Evan Edwards, Evan Edwards MR#:  [id number] DATE OF BIRTH:  [birth year]  DATE OF PROCEDURE:  07/26/2013  SURGEON: Hulda Marin, MD  ASSISTANT: Natale Lay, MD  PREOPERATIVE DIAGNOSIS: Left lower lobe mass with probable empyema and mediastinal lymphadenopathy.  POSTOPERATIVE DIAGNOSIS: Left lower lobe mass with probable empyema and mediastinal lymphadenopathy.  OPERATION PERFORMED:  1.  Preoperative bronchoscopy to assess endobronchial anatomy.  2.  Left thoracoscopy with conversion to thoracotomy with decortication of visceral and parietal pleura.  3.  Mediastinal lymph node biopsy.   INDICATIONS FOR PROCEDURE: Evan Edwards is a 44 year old man who has a history of a left lower lobe mass or infiltrate who presented to this hospital with fevers, chills, and a clinical picture consistent with pneumonia and empyema. He was offered the above-named procedure after extensive discussion with the patient and failure to respond intrapleural tPA. The patient gave his informed consent.   DESCRIPTION OF PROCEDURE: The patient was brought to the operating suite and placed in the supine position. General endotracheal anesthesia was given with a double-lumen tube. Preoperative bronchoscopy was carried out. There was no evidence of endobronchial tumor in the subsegmental level bilaterally. There was a small amount of purulent secretion on the right side but the left side was actually clear.   The patient was then turned for a left thoracoscopy. All pressure points were carefully padded. The patient was prepped and draped in the usual sterile fashion. We began by making an incision in the fifth intercostal space in the posterior axillary line. The incision was deepened down through the muscles of the chest wall until the pleural space was entered. Once the pleural space was entered, I could see that there was an extensive amount of dense adherent lung to the chest wall. We freed up sufficient lung by using  gentle sweeping motion of the index finger until we were able to get scope into the chest.   Once we did this, we attempted to identify a second port site posteriorly. However, this was not possible to see and we placed this again in the open technique but could not visualize the port entry site.   Once we entered the chest posteriorly, we attempted to fill in the pleural space, but again this was densely adherent and stuck. We therefore elected to open the chest. The two thoracoscopy incisions were connected with a scalpel and using electrocautery, the muscles of the chest wall were divided until the pleural space was entered.   Once the pleural space was entered, we then were able to free the lung up off the diaphragm and off the mediastinum. Posteriorly the lung was adherent to the diaphragm and posterior chest wall. All of these areas were freed up and that gave [place] good access to the lung then for decortication. This was accomplished. There is extensive purulent gelatinous material present. Appropriate aliquots were sent for culture.   Once this was completed, we then turned our attention to the mediastinum where there was a large 2 cm AP window lymph node. A portion of this was excised and sent for pathology. The chest was again copiously irrigated and hemostasis was complete. Three 32-French Blake drains were inserted. They were secured to the chest wall with loops of chromic suture. The most anterior tube went anteriorly. The middle tube was angled along the paravertebral space and the most posterior tube was positioned along the diaphragm underneath the lung. These tubes were secured to the skin with Prolene. The chest was then  closed with #2 Vicryl paracostal sutures on the ribs and chest wall muscles. The subcutaneous tissues were closed with 2-0 Vicryl and the skin with skin clips. The patient tolerated the procedure well, was extubated and taken to the recovery room in stable condition.     ____________________________ Sheppard Plumberimothy E. Thelma Bargeaks, MD teo:np D: 07/26/2013 14:10:50 ET T: 07/26/2013 22:37:08 ET JOB#: [id number]  cc: Sheppard Plumberimothy E. Thelma Bargeaks, MD, <Dictator> Mark A. Egbert GaribaldiBird, MD Jasmine DecemberIMOTHY E Martika Egler MD ELECTRONICALLY SIGNED 07/29/2013 19:42

## 2014-09-21 NOTE — Consult Note (Signed)
PATIENT NAME:  Evan Edwards, Evan Edwards MR#:  [id number] DATE OF BIRTH:  09-18-1970  DATE OF CONSULTATION:  07/23/2013  REQUESTING PHYSICIAN:  Enedina FinnerSona Patel, MD CONSULTING PHYSICIAN:  A. Wendall MolaMelissa Solum, MD  CHIEF COMPLAINT: Thyroid nodules.   HISTORY OF PRESENT ILLNESS: This is a 44 year old male admitted with cough and chest pain and found to have concern for parapneumonic effusion. The patient had been hospitalized at an outside facility about 2 weeks ago with pneumonia.   Chest CT done earlier today for chest pain showed no evidence of PE; however, there was finding of enlargement of the thyroid with suggestion of thyroid nodules.   The patient then underwent a neck ultrasound earlier today which demonstrated 2 left-sided thyroid nodules which measured 1.1 x 1.5 x 1.2 cm (inferior) and 2.6 x 1.8 x 2.4 cm (mid to upper lobe). The patient has no prior history of thyroid disease. He denies any history of neck radiation. He denies any family history of thyroid disease. He denies any dysphagia or recent voice changes. No hoarseness. The patient has not noted any change in the appearance of the thyroid gland or neck.   PAST MEDICAL HISTORY: Pneumonia, treated just 2 weeks ago.   PAST SURGICAL HISTORY: Appendectomy.   ALLERGIES: No known drug allergies.   FAMILY HISTORY: No known thyroid disease.   SOCIAL HISTORY: He is a Naval architecttruck driver. He denies tobacco use.   CURRENT MEDICATIONS:  1.  Levaquin 750 mg IV q.24 hours.  2.  Protonix 40 mg q.12 hours.  3.  Zosyn 4.5 g IV q.8 hours.  4.  Lovenox 40 mg subcu daily.  5.  Normal saline at 75 mL/hour.    REVIEW OF SYSTEMS:  GENERAL: No weight loss. He had been febrile with a T-max of 102.1 today at 1 p.m.  HEENT: No blurred vision. No sore throat.  NECK: No neck pain. No dysphasia.  CARDIAC: No chest pain or palpitations.  PULMONARY: Shortness of breath, much improved since undergoing thoracentesis earlier today. He does have a cough.  ABDOMEN:  Denies abdominal pain. Reports good appetite.  ENDOCRINE: Denies heat or cold intolerance.  GENITOURINARY: Denies dysuria or hematuria.  HEMATOLOGIC: Denies easy bruisability or recent bleeding.  EXTREMITIES: Denies swelling or arthralgias.  SKIN: Denies rash or recent skin changes.   PHYSICAL EXAMINATION:  VITAL SIGNS: Height 66.9 inches and 200 pounds. Temperature 99.9, pulse 113 to 124, respiratory rate 20, blood pressure 134/87, pulse oximetry 92% on 2 liters.  GENERAL: Well-developed white male in no acute distress.  HEENT: EOMI. Oropharynx is clear. No proptosis, lid lag or stare.  NECK: Supple. There is some fullness on the left lobe of the thyroid; however, I was unable to palpate any discrete nodule. There is no neck tenderness elicited.  LYMPH: No submandibular or anterior cervical lymphadenopathy.  CARDIAC: Tachycardia without murmur. No carotid bruit.  PULMONARY: Good air movement throughout. No wheeze.  ABDOMEN: Diffusely soft, nontender.  EXTREMITIES: No peripheral edema is present.  SKIN: No rash or dermatopathy is present.  NEUROLOGIC: No focal deficits. No dysphasia. No tremor of the outstretched hands.   LABORATORY, DIAGNOSTIC AND RADIOLOGICAL DATA: Glucose 161, BUN 13, creatinine 1.2, sodium 136, potassium 3.1. WBC 15.2, hematocrit 33.1, platelets 303.   ASSESSMENT: A 44 year old male with recent history of pneumonia admitted for effusion, concerning for parapneumonic effusion or empyema versus other sources of effusion. Found to have incidental thyroid nodules on CT scan prompting an ultrasound showing a 1.5 and a 2.6 cm left-sided thyroid  nodule.   RECOMMENDATIONS:  1.  Obtain a TSH to ensure he does not have toxic thyroid nodules.  2.  Anticipate that if TSH is normal, we could consider a ultrasound-guided FNA of these 2 nodules. I discussed this procedure with him and he was agreeable. I would like to do this after his current clinical condition has resolved, perhaps  in a few weeks on an outpatient basis. I will make this arrangement when we are closer to hospital discharge.  3.  No indication that thyroid nodules are playing a current role in his clinical condition. No further testing needed during hospitalization.   Thank you for the kind request for consultation.   ____________________________ A. Wendall Mola, MD ams:np D: 07/23/2013 22:46:59 ET T: 07/23/2013 22:55:08 ET JOB#: [id number]  cc: A. Wendall Mola, MD, <Dictator> Macy Mis MD ELECTRONICALLY SIGNED 08/06/2013 21:08

## 2014-09-21 NOTE — Consult Note (Signed)
PATIENT NAME:  Evan Edwards, Evan Edwards MR#:  [id number] DATE OF BIRTH:  [birth year]  DATE OF CONSULTATION:  07/24/2013  REFERRING PHYSICIAN:  Sona A. Allena KatzPatel, MD CONSULTING PHYSICIAN:  Sheppard Plumberimothy E. Javonda Suh, MD  REASON FOR REQUEST: Left pleural effusion.   HISTORY: I have personally seen and examined Mr. Evan Edwards. I have discussed his care with Dr. Malachy MoanHeath McCullough in interventional radiology, Enedina FinnerSona Patel of the hospitalist service and Dr. Santiago Gladavid Kasa in pulmonary critical care. I have independently reviewed his x-rays. I have spoken to the family and reviewed the x-rays with the patient, his sister and his parents.   HISTORY OF PRESENT ILLNESS: Mr. 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 His home is in [place], [place], but he was driving his truck to [place] when he was hospitalized with a left lower lobe pneumonia. He was admitted to [facility], where he spent several days on IV antibiotics and was ultimately discharged to follow up with his primary care physician. He was given a prescription for Levaquin, which he states he did have filled and took those. Several days after completion of the oral antibiotics, he again experienced severe cough with fever and sweats. He noticed he developed some left-sided pleuritic chest pain several days ago and came back [facility] [place] [facility]. When he arrived here in [place], he took some over-the-counter medications to medicate himself for his cough and fever, and presented to our Emergency Department where a chest x-ray and CT scan were performed. This revealed a loculated left-sided pleural effusion as well as an infiltrate in the left lower lobe consistent with a parapneumonic effusion and a left lower lobe pneumonia. In addition, the patient had marked thickening of the esophagus consistent with esophagitis and mediastinal lymphadenopathy as  well as a thyroid mass. He had an ultrasound of the thyroid revealing 2 discrete nodules in the left lobe of the thyroid and it was recommend that he have these biopsied. In addition, he was seen by Dr. Marva PandaSkulskie in GI medicine, with whom I have discussed the case. Dr. Marva PandaSkulskie believes that the patient has significant esophagitis and has recommended an esophagogastroduodenoscopy when the patient is more stable. Of note is that the patient has severe microcytic anemia and during my evaluation today, the patient went into atrial fibrillation with a rapid ventricular response and required transfer to the unit for a Cardizem drip.   The patient does not smoke. He does have a lifelong history, since his early teenage years, of severe reflux. He states that he tries to manage this as best he can and did, indeed, have an esophagoscopy done approximately 3 years ago, which revealed only some mild esophagitis without evidence of any tumor. There was no mention of any Barrett's esophagus present.   PAST MEDICAL HISTORY: Significant only for an appendectomy in the past. He does have a history of chronic anemia and chronic reflux.   SOCIAL HISTORY: He is not married. He is a Naval architecttruck driver. He does not drink or smoke or use any other illicit drugs.   FAMILY HISTORY: Positive for his biological father dying at an early age of massive myocardial infarction. There is no other family history of any lung or cardiovascular disease.   REVIEW OF SYSTEMS: As per history of present illness and all other review of systems were asked and were negative.   PHYSICAL EXAMINATION: GENERAL: Well-developed, well-nourished male in no acute distress. He  was somewhat diaphoretic. He was able to speak in complete sentences and was not complaining of any shortness of breath. He did have an occasional cough, which was nonproductive.  LUNGS: His lungs showed diffuse rales on the left and were clear on the right.  NECK: Supple without  thyromegaly or adenopathy.  HEART: Regular, although somewhat tachycardic. When I felt his peripheral pulses, he appeared to be in atrial fibrillation and reauscultation of the heart did confirm the presence of atrial fibrillation.  ABDOMEN: Soft and nontender. There were no palpable masses. There was no hepatosplenomegaly.  EXTREMITIES:  There was no clubbing, cyanosis or edema.   ASSESSMENT AND PLAN: I have reviewed the films with Dr. Malachy Moan in interventional radiology and Dr. Santiago Glad in pulmonary critical care. In addition, I discussed the case with Dr. Marva Panda in GI medicine. Several options are available for management of his pleural space. We could place a percutaneous drain into the pleural space and try intrapleural tPA for management. Alternatively, we could try a thoracoscopy or thoracotomy, but given his recent onset of atrial fibrillation, he would be at a higher risk for any perioperative complications. Dr. Archer Asa felt that it would be possible to place a percutaneous pigtail catheter using CT or ultrasound guidance and we could try tPA for a day or 2 to see if we are able to manage his pleural space without surgical intervention. Dr. Marva Panda felt that he did not want to sedate the patient at the present time to undergo an esophagoscopy; however, if the patient did require general anesthesia for surgical intervention, that he could perform an endoscopy at that time. After extensive discussion with the patient, his sister who is an Charity fundraiser and his family, we have agreed to move the patient to the intensive care unit and begin a Cardizem drip. We will also make the patient n.p.o. We will type and cross the patient for blood. We will attempt the percutaneous pigtail catheter insertion by radiology and I will attempt to try to manage his pleural space without surgical intervention. However, should the need arise, the patient has no objections to undergoing a thoracoscopy or a thoracotomy  if required.    ____________________________ Sheppard Plumber. Thelma Barge, MD teo:cs D: 07/24/2013 14:09:00 ET T: 07/24/2013 14:38:07 ET JOB#: [id number]  cc: Marcial Pacas E. Thelma Barge, MD, <Dictator>   Jasmine December MD ELECTRONICALLY SIGNED 07/29/2013 19:42

## 2014-09-21 NOTE — Consult Note (Signed)
Chief Complaint:  Subjective/Chief Complaint patietn seen for abnormal ct of esophagus.  long h/o poorly controlled GERD.  currently patietn denies heartburn, dysphagia or abdominal pain.  Was tolerating regular diet yesterday.   VITAL SIGNS/ANCILLARY NOTES: **Vital Signs.:   26-Feb-15 07:00  Vital Signs Type Routine  Pulse Pulse 86  Pulse source if not from Vital Sign Device per cardiac monitor  Respirations Respirations 22  Systolic BP Systolic BP 101  Diastolic BP (mmHg) Diastolic BP (mmHg) 65  Mean BP 77  Pulse Ox % Pulse Ox % 95  Oxygen Delivery 4L  Pulse Ox Heart Rate 86  Telemetry pattern Cardiac Rhythm Normal sinus rhythm    08:00  Vital Signs Type Routine  Celsius 36.5  Temperature Source oral  Pulse Pulse 94  Pulse source if not from Vital Sign Device per cardiac monitor  Respirations Respirations 32  Pulse Ox % Pulse Ox % 92  Oxygen Delivery Room Air/ 21 %  Pulse Ox Heart Rate 94  Telemetry pattern Cardiac Rhythm Normal sinus rhythm   Brief Assessment:  Cardiac Regular   Respiratory clear BS  left diminished   Gastrointestinal details normal Soft  Nontender  Nondistended  Bowel sounds normal   Lab Results: LabObservation:  25-Feb-15 13:07   OBSERVATION Reason for Test  Routine BB:  25-Feb-15 12:17   Crossmatch Unit 1 -  Crossmatch Unit 2 -  Cardiology:  25-Feb-15 09:12   Ventricular Rate 99  Atrial Rate 99  P-R Interval 132  QRS Duration 84  QT 344  QTc 441  P Axis 52  R Axis 21  T Axis 43  ECG interpretation Normal sinus rhythm Normal ECG When compared with ECG of 24-Jul-2013 12:05, Sinus rhythm has replaced Atrial fibrillation Vent. rate has decreased BY 100 BPM ST no longer depressed in Anterior leads Nonspecific T wave abnormality no longer evident in Inferior leads Confirmed by MORAYATI, SAM ([id number]) on 07/25/2013 6:13:38 PM  Overreader: Patrecia PaceMORAYATI, SAM    13:07   Echo Doppler REASON FOR EXAM:     COMMENTS:     PROCEDURE: ECH - ECHO  DOPPLER COMPLETE(TRANSTHOR)  - Jul 25 2013  1:07PM   RESULT: Echocardiogram Report  Patient Name:   Evan Edwards Date of Exam: 07/25/2013 Medical Rec #:  [id number] Custom1: Date of Birth:  01/18/1971              Height:       66.9 in Patient Age:    44 years               Weight:       200.6 lb Patient Gender: M                      BSA:          2.02 m??  Indications: Atrial Fib Sonographer:    Dorene SorrowJerry HegeRDCS Referring Phys: Ramonita LabGOURU, ARUNA  Sonographer Comments: Technically challenging study due to less than  ideal echo windows and Drainage tube in apical space.  Summary:  1. Rhythm is normal sinus rhythm  2. Challenging image quality. Grossly normal study.  3. Left ventricular ejection fraction, by visual estimation, is 60 to  65%.  4. Normal global left ventricular systolic function.  5. Normal right ventricular size and systolic function. 2D AND M-MODE MEASUREMENTS (normal ranges within parentheses): Left Ventricle:          Normal IVSd (2D):      1.00 cm (0.7-1.1) LVPWd (2D):  1.44 cm (0.7-1.1) Aorta/LA:                  Normal LVIDd (2D):     4.07 cm (3.4-5.7) Aortic Root (2D): 3.20 cm (2.4-3.7) LVIDs (2D):     2.67 cm          Left Atrium (2D): 3.50 cm (1.9-4.0) LV FS (2D):     34.4 %   (>25%) LV EF (2D):     64.0 %   (>50%)                                   Right Ventricle:                                   RVd (2D):        2.33 cm LV DIASTOLIC FUNCTION: MV Peak E: 4.09 m/s E/e' Ratio: 6.30 MV Peak A: 0.62 m/s Decel Time: 236 msec E/A Ratio: 1.14 SPECTRAL DOPPLER ANALYSIS (where applicable): Mitral Valve: MV P1/2 Time: 68.44 msec MV Area, PHT: 3.21 cm?? Aortic Valve: AoV Max Vel: 1.52 m/s AoV Peak PG: 9.2 mmHg AoV Mean PG: LVOT Vmax: 1.31 m/s LVOT VTI:  LVOT Diameter: 2.10 cm AoV Area, Vmax: 2.99 cm?? AoV Area, VTI:  AoV Area, Vmn: Tricuspid Valve and PA/RV Systolic Pressure: TR Max Velocity: 1.48 m/s RA  Pressure: 5 mmHg RVSP/PASP: 13.8 mmHg Pulmonic  Valve: PV Max Velocity: 1.22 m/s PV Max PG: 6.0 mmHg PV Mean PG:  PHYSICIAN INTERPRETATION: Left Ventricle: The left ventricular internal cavity size was normal. LV  posterior wall thickness was normal. No left ventricular hypertrophy.  Global LV systolic function was normal. Left ventricular ejection  fraction, by visual estimation, is 60 to 65%. Spectral Doppler shows  normal pattern of LV diastolic filling. Right Ventricle: Normal right ventricular size, wall thickness, and   systolic function. The right ventricular size is normal. Global RV  systolic function is normal. Left Atrium: The left atrium is normal in size. Right Atrium: The right atrium is normal in size. Pericardium: There is no evidence of pericardial effusion. Mitral Valve: The mitral valve is not well seen. The mitral valve is  normal in structure. Tricuspid Valve: The tricuspid valve is not well seen. The tricuspid  valve is normal. Trivial tricuspid regurgitation is visualized. The  tricuspid regurgitant velocity is 1.48 m/s, and with an assumed right  atrial pressure of 5 mmHg, the estimated right ventricular systolic  pressure is normal at 13.8 mmHg. Aortic Valve: The aortic valve was not well seen. The aortic valve is  normal. The aortic valve is structurally normal, with no evidence of  sclerosis or stenosis. Pulmonic Valve: The pulmonic valve is not well seen. The pulmonic valve  is normal. Aorta: The aortic root and ascending aorta are structurally normal, with  no evidence of dilitation. The ascending aorta was not well visualized.  The aortic arch was not well visualized.  [id number] Julien Nordmann MD Electronically signed by [id number] Julien Nordmann MD Signature Date/Time: 07/25/2013/7:15:40 PM  *** Final ***  IMPRESSION: .   Verified By: Antonieta Iba, M.D., MD  Routine Chem:  25-Feb-15 12:17   Result Comment XMATCH 2 - DUPLICATE ORDER. 4 UNITS AVAILABLE.  - SPOKE WITH LIBBY COBB, CCU AT 2030  -  07/25/2013. TFK  Result(s) reported on 25 Jul 2013 at 09:31PM.  Routine Hem:  25-Feb-15 07:26   WBC (CBC)  22.6  RBC (CBC) 5.14  Hemoglobin (CBC)  8.4  Hematocrit (CBC)  29.5  Platelet Count (CBC) 238  MCV  58  MCH  16.4  MCHC  28.5  RDW  20.6  Neutrophil % 88.2  Lymphocyte % 5.0  Monocyte % 6.1  Eosinophil % 0.4  Basophil % 0.3  Neutrophil #  19.9  Lymphocyte # 1.1  Monocyte #  1.4  Eosinophil # 0.1  Basophil # 0.1 (Result(s) reported on 25 Jul 2013 at 07:45AM.)   Radiology Results: CT:    26-Feb-15 07:47, CT Chest With Contrast  CT Chest With Contrast   REASON FOR EXAM:    assess for pleural effusion  COMMENTS:       PROCEDURE: CT  - CT CHEST WITH CONTRAST  - Jul 26 2013  7:47AM     CLINICAL DATA:  Pleural effusion    EXAM:  CT CHEST WITH CONTRAST    TECHNIQUE:  Multidetector CT imaging of the chestwas performed following the  standard protocol without IV contrast..    COMPARISON:  CT ANGIO CHEST dated 07/23/2013  FINDINGS:  Pigtail left thoracostomy has been placed via fifth intercostal  anterior axillary line approach. It is coiled in the lateral hemi  thorax. The left pleural effusion is improved. A loculated basilar  pleural effusion probably separate from the localized area of the  chest tube remains at the left base posteriorly. There is also a  small amount of pleural effusion in the anterior left base, adjacent  to the left heart border. There is pleural fluid posterior to the  left upper lobe on image 27 which may be free-flowing. No  pneumothorax.    Overall, heterogeneous opacities throughout the left lower lobe and  lingula are not significantly changed. No evidence of airway  obstruction or obvious endobronchial lesion. Ground-glass opacities  in the left upper lobe improved. Linear opacities within the left  upper lobe likely a combination of volume loss and interlobular  septal thickening are increased.    Linear and central right airspace  opacities increased. Small right  pleural effusion has developed.    Stable left thyroid mass    Stable mediastinal adenopathy.    Diffuse esophageal wall thickening has improved.    Small pericardial effusion is present and slightly worse.    No acute bony deformity.   IMPRESSION:  Left chest tube placed with improvement in the left pleural  effusion. It is positioned in the left upper lateral hemi thorax.    Persistent loculated pleural effusion at the left base which is  probably separate from the location of the left chest tube. Other  areas of pleural fluid as described above.    Pulmonary parenchymal changes throughout the left lung are not  significantly changed. No endobronchial lesion.    New airspace opacities within the right lung with a central  distribution. Small right pleural effusion has developed    Very small pericardial effusion has probably increased.  Stable mediastinal adenopathy.    Improved esophageal wall thickening.    Overall, findings remain worrisome for an inflammatory process,  especially opportunistic infection in an immunocompromised patient.  Correlation with immune status is recommended. Underlying malignancy  is not excluded.      Electronically Signed    By: Maryclare Bean M.D.    On: 07/26/2013 07:57       Verified By: Donavan Burnet, M.D.,   Assessment/Plan:  Assessment/Plan:  Assessment 1) abnormal CT with diffuse thickening of the esophagus. CT film reviewed.  Improvement noted.  Case discussed with Dr Inez Catalina.  Will hold plans for egd following the VATS today, continue bid ppi, egd in a few weeks as outpatient once current clinical situation improves.  Dr Inez Catalina in agreement.  2) left PNA/effusion, partial response to chest tubes-to have surgery today.   Plan 1) as above. following   Electronic Signatures: Barnetta Chapel (MD)  (Signed 26-Feb-15 10:28)  Authored: Chief Complaint, VITAL SIGNS/ANCILLARY NOTES, Brief Assessment,  Lab Results, Radiology Results, Assessment/Plan   Last Updated: 26-Feb-15 10:28 by Barnetta Chapel (MD)

## 2014-09-21 NOTE — Consult Note (Signed)
PATIENT NAME:  Evan Edwards, COMMON MR#:  [id number] DATE OF BIRTH:  [birth year]  DATE OF CONSULTATION:  07/23/2013  PHYSICIAN: Dr. Amado Coe.  CONSULTING PHYSICIAN:  Christena Deem, MD  REASON FOR CONSULTATION: Abnormal CT scan of the chest/esophagus.   HISTORY OF PRESENT ILLNESS: Mr. Rabideau is a 45 year old Caucasian male who was admitted to the hospital earlier today with recurrent pneumonia. He states that a little over 2 weeks ago, he was actually in a hospital in [place], [place], at that time also being treated for pneumonia. He stated that about 2 nights ago, he had a more severe than normal episode of heartburn. Then his chest began to hurt. This was then followed with increasing shortness of breath and cough and he came to the Emergency Room here.   He was found to have a marked pleural effusion as well as possibly loculated fluid in the left chest as well as atypical finding of thickening of the esophageal wall in the mid esophagus. The patient states that he has had heartburn daily for many years since he was a child. He has been taking different medications, likely under treating this, and currently takes Zantac twice  or 2 tablets a day.   He denies any dysphagia. He has no nausea, vomiting, or abdominal pain. His main complaint is that of indigestion and heartburn daily. He states that even water will give him heartburn. He has a daily bowel movement. There is no black stool, blood in the stool, or slimy stools; however, he states his stools have been watery for about 2 weeks.   He did have an EGD done by Dr. Niel Hummer on 01/26/2012 for iron deficiency anemia. He was found to have a grade B erosive esophagitis with a small hiatal hernia; however, the stomach and duodenum was normal.   PAST MEDICAL HISTORY: History of remote appendectomy. Recent pneumonia as noted above. He denies any problems of high blood pressure, diabetes, thyroid problems, liver or kidney problems. He  states that in the past, he has been told he had an ulcer; however, that was not apparent from the EGD done 2 years ago.   ALLERGIES: No known drug allergies.   OUTPATIENT MEDICATIONS: The patient takes over-the-counter Zantac. At times he will also takes over-the-counter PPIs. Could not be more specific.   PHYSICAL EXAMINATION:  VITAL SIGNS: Temperature is 97.8, pulse 113, respirations 20, blood pressure 122/74, pulse oximetry 93%.  GENERAL: A 44 year old Caucasian male in no acute distress. He is quite agitated.  HEENT: Normocephalic, atraumatic.  EYES: Anicteric.  NOSE: Septum midline. No lesions.  OROPHARYNX: No lesions.  NECK: Supple. No JVD.  HEART: Regular rate and rhythm/tachycardia.  LUNGS: Right lung is clear. The left lung shows some clearness in the upper region, however, decreased breath sounds lower as well as dull.  ABDOMEN: Soft, nontender, nondistended. Bowel sounds positive, normoactive.  ANORECTAL: Exam deferred.   LABORATORY, DIAGNOSTIC, AND RADIOLOGICAL DATA: Include: On admission to the hospital, he had glucose of 161, BUN 13, creatinine 1.27, sodium 136, potassium 3.1, chloride 104, bicarb 20, troponin I x1 less than 0.02. Thyroxine: Free thyroxine 1.15.  TSH was 0.849. Hemogram showing a white count of 15.2, H and H 9.2/33.1, platelet count of 303. MCV is 57.   He did have a thoracentesis of the left chest today, this showing a nucleated cell count of 153,708,  94% neutrophils, 4% lymphocytes.   He has had blood cultures x2 that were no growth.   He had an EKG  showing normal sinus rhythm, inferior changes possibly consistent with infarct, age undetermined.   Abnormal ECG: No previous for evaluation.   He had a CT scan/CT angiography of the chest for PE. This showed a moderate left-sided pleural effusion with diffuse underlying interstitial prominence, patchy airspace disease likely reflecting acute infectious process, possibly atypical in nature, multiple areas  of atelectasis. No underlying mass definitely seen. He also was noted to have a diffuse wall thickening of the mid esophagus concerning for esophagitis. Comment made that an esophageal mass could have similar appearance although this seems to be more diffuse than would be seen with a mass.  Recommendation to correlate with patient's symptoms. He has scattered enlarged periaortic and aortopulmonary window, subcarinal and azygoesophageal recess nodes up to 1.5 cm. There is also a 4.2 cm left thyroid mass with some calcifications.   ASSESSMENT: Abnormal CT scan with finding of thickened mid region of the esophagus. In review of this film, his esophagus is actually relatively abnormal from the region of the GE junction up through the carinal region. The patient states he has had long-term chronic daily, severe reflux/heartburn for many years. He takes medications for this although on an inconsistent dosing schedule/agent schedule. He denies any dysphagia.   RECOMMENDATIONS:  1.  The patient is currently on a single dose of Protonix daily. I will change this to b.i.d. IV Protonix. He will need to continue on b.i.d. p.o. Protonix once he leaves the hospital. Currently with his respiratory difficulty and status, he is not a candidate for luminal evaluation via sedated procedure. We will follow with you. If it becomes necessary to do luminal evaluation in the interim, would recommend a barium swallow instead of a sedated procedure.  2.  Also changes in regards to PPI as outlined above.  3.  We will also obtain a Helicobacter pylori serology.  4.  Also, the patient has complained of some watery stools and has been on multiple antibiotics over the period of the past several weeks. We will also put in for stool studies to include C. diff/  Thank you for this consult. We will follow with you.    ____________________________ Christena DeemMartin U. Channa Hazelett, MD mus:np D: 07/23/2013 21:32:51 ET T: 07/23/2013 21:55:07  ET JOB#: [id number]  cc: Christena DeemMartin U. Maven Rosander, MD, <Dictator> Christena DeemMARTIN U Codi Folkerts MD ELECTRONICALLY SIGNED 07/30/2013 16:01

## 2014-09-21 NOTE — Consult Note (Signed)
Chief Complaint:  Subjective/Chief Complaint seen for GERD.  doeing well.  ate a subway sandwich for lunch.  denies heartburn dysphagia or nausea or abdominal pain. positive bm over the weekend.   VITAL SIGNS/ANCILLARY NOTES: **Vital Signs.:   02-Mar-15 15:00  Vital Signs Type Routine  Celsius 36.8  Temperature Source oral  Pulse Pulse 97  Respirations Respirations 18  Systolic BP Systolic BP 694  Diastolic BP (mmHg) Diastolic BP (mmHg) 68  Mean BP 87  Pulse Ox % Pulse Ox % 92  Pulse Ox Activity Level  At rest  Oxygen Delivery Room Air/ 21 %   Brief Assessment:  Cardiac Regular   Respiratory clear BS   Gastrointestinal details normal Soft  Nontender  Nondistended  No masses palpable  Bowel sounds normal   Lab Results: TDMs:  02-Mar-15 12:57   Vancomycin, Trough LAB 17  Routine Chem:  02-Mar-15 02:59   Result Comment LABS - This specimen was collected through an   - indwelling catheter or arterial line.  - A minimum of 32mls of blood was wasted prior    - to collecting the sample.  Interpret  - results with caution.  Result(s) reported on 30 Jul 2013 at 03:17AM.  Result Comment HGB/HT - RESULTS VERIFIED BY REPEAT TESTING.  - TPL  Result(s) reported on 30 Jul 2013 at 03:32AM.  Creatinine (comp) 0.64  eGFR (African American) >60  eGFR (Non-African American) >60 (eGFR values <79mL/min/1.73 m2 may be an indication of chronic kidney disease (CKD). Calculated eGFR is useful in patients with stable renal function. The eGFR calculation will not be reliable in acutely ill patients when serum creatinine is changing rapidly. It is not useful in  patients on dialysis. The eGFR calculation may not be applicable to patients at the low and high extremes of body sizes, pregnant women, and vegetarians.)    12:57   Result Comment LABS - This specimen was collected through an   - indwelling catheter or arterial line.  - A minimum of 42mls of blood was wasted prior    - to collecting  the sample.  Interpret  - results with caution.  Result(s) reported on 30 Jul 2013 at 01:42PM.  Routine Hem:  02-Mar-15 02:59   WBC (CBC) 10.0  RBC (CBC) 4.54  Hemoglobin (CBC)  7.4  Hematocrit (CBC)  25.6  Platelet Count (CBC) 285  MCV  57  MCH  16.3  MCHC  28.8  RDW  20.6  Neutrophil % 76.6  Lymphocyte % 10.1  Monocyte % 9.2  Eosinophil % 3.6  Basophil % 0.5  Neutrophil #  7.7  Lymphocyte # 1.0  Monocyte # 0.9  Eosinophil # 0.4  Basophil # 0.0   Assessment/Plan:  Assessment/Plan:  Assessment 1) abnormal CT in setting of long history of severe GERD.  much improvement on repeat ct.  patietn denies all upper GI sx, he has never had dysphagia.  2) PNA, pleural effusion, s/p VATS/decortication. doing well.   Plan 1) continue bid ppi, change to po protonix 40 mg bid.  continue that dose for 6-8 weeks, then decrease to once a day.  always take one hour AC.  He will need to have a barium swallow prior to d/c.   He will also need to have GI fu in 3 weeks or so as outpatient.   Electronic Signatures: Loistine Simas (MD)  (Signed 02-Mar-15 16:41)  Authored: Chief Complaint, VITAL SIGNS/ANCILLARY NOTES, Brief Assessment, Lab Results, Assessment/Plan   Last Updated: 02-Mar-15 16:41 by  Loistine Simas (MD)

## 2014-09-21 NOTE — H&P (Signed)
PATIENT NAME:  Evan Edwards, Evan Edwards MR#:  [id number] DATE OF BIRTH:  Apr 14, 1971  DATE OF ADMISSION:  07/23/2013  PRIMARY CARE PHYSICIAN: Nonlocal.  REFERRING PHYSICIAN: Dr. Zenda Alpers.   CHIEF COMPLAINT: Cough and shortness of breath.   HISTORY OF PRESENT ILLNESS: The patient is a 44 year old male with no significant past medical history who works as a [employer] architect is presenting to the ER with the chief complaint of shortness of breath and left-sided chest pain while coughing. While the patient was in [place] approximately 2 weeks ago, he was diagnosed with pneumonia. He was admitted to the hospital and discharged home with p.o. antibiotics. The patient finished the antibiotic course, but still he is having a nasty cough with greenish-yellow phlegm and left-sided chest pain without improving, also associated with shortness of breath. Denies any fever. No palpitations, diaphoresis. Came into the ER and CT angiogram of the chest was done. Pulmonary embolism was ruled out and it has revealed moderate left-sided pleural effusion with patchy airspace opacification in the left lung, which may reflect acute infectious process. Blood cultures were ordered and the patient was given Zosyn and vancomycin in the ER. Hospitalist team is called to admit the patient. During my examination, the patient is resting comfortably. No family members at bedside. Still complaining of left-sided chest pain while coughing only. Denies any other complaints.   PAST MEDICAL HISTORY: None.   PAST SURGICAL HISTORY: Appendectomy.   ALLERGIES: No known drug allergies.   PSYCHOSOCIAL HISTORY: Lives at home. Denies smoking. Occasional intake of alcohol. Denies any street drugs.   FAMILY HISTORY: Dad had a heart attack.  MEDICATIONS: Not on any home medications.   REVIEW OF SYSTEMS: CONSTITUTIONAL: Denies fever. Complaining of fatigue.  EYES: Denies blurry vision, double vision.  ENT: Denies epistaxis, discharge.   RESPIRATORY: Complaining of productive cough with greenish-yellow phlegm, left-sided pleuritic chest pain with breathing. GASTROINTESTINAL: Denies nausea, vomiting, diarrhea, abdominal pain.  GENITOURINARY: No dysuria or hematuria.  ENDOCRINE: Denies polyuria, nocturia.  HEMATOLOGIC AND LYMPHATIC: No anemia, easy bruising, bleeding.  INTEGUMENTARY: No acne, rash, lesions.  MUSCULOSKELETAL: No joint pain in the neck and back. Denies gout.  NEUROLOGIC: No vertigo, ataxia.  PSYCHIATRIC: No ADD or OCD.   PHYSICAL EXAMINATION:  VITAL SIGNS: Temperature 98 degrees Fahrenheit, pulse 92, respirations 20, blood pressure 118/72, pulse ox 97%.  GENERAL APPEARANCE: Not in acute distress. Moderately built and nourished.  HEENT: Normocephalic, atraumatic. Pupils are equally reacting to light and accommodation. No scleral icterus. No conjunctival injection. No sinus tenderness. No postnasal drip.  NECK: Supple. No JVD.  Range of motion of the neck is intact.  LUNGS: Moderate air entry. Decreased breath sounds on the left side. Positive crackles and rales. No anterior cervical tenderness on palpation.  CARDIAC: S1, S2 normal. Regular rate and rhythm. No murmur.  ABDOMEN: Soft. Bowel sounds are positive in all 4 quadrants. Nontender, nondistended. No hepatosplenomegaly. No masses felt.  NEUROLOGIC: Awake, alert, and oriented x3. Motor and sensory grossly intact. Reflexes are 2+.  EXTREMITIES: No edema. No cyanosis. No clubbing.  SKIN: Warm to touch. Normal turgor. No rashes. No lesions.  MUSCULOSKELETAL: No joint effusion, tenderness, erythema.  PSYCH: Normal mood and affect.  LABS AND IMAGING STUDIES: CT angiogram of the chest has revealed: 1.  Moderate left-sided pleural effusion, diffuse underlying interstitial prominence, dense patchy airspace opacification of the left lung. This may reflect an acute infectious process, possibly atypical in nature. Multiple areas of atelectasis were noticed. No  underlying mass is seen,  though it cannot be entirely excluded. If the pleural effusion persists despite treatment, diagnostic thoracentesis could be considered for further evaluation.  2.  Diffuse wall thickening along the medial esophagus concerning for esophagitis.  3.  An esophageal mass could have similar appearance though it is more diffuse than typically seen for mass. Would correlate with the patient's symptoms.  4.  Scattered enlarged periaortic aortopulmonary window subcranial azygoesophageal recess nodes measuring up to 1.5 in short axis. These are of uncertain significance.  5.  Diffuse epicardial fat edema noted on the left side.  6.  A 4.2 cm left thyroid mass with scattered calcification. Consider further evaluation with thyroid ultrasound. If the patient is clinically hypothyroid, consider nuclear medicine thyroid uptake and scan.   Chest x-ray, PA and lateral views, has revealed dense opacification at the left lung base with a small left pleural effusion. This most likely reflects pneumonia although if the patient has already been on antibiotic for past 2 weeks would correlate to exclude pulmonary embolus.   Troponin less than 0.02. WBC 15.2, hemoglobin 9.2, hematocrit 33.1, platelets 303, MCV 57. Chem-8: Potassium is 3.1. Anion gap is 12. CO2 20. Glucose 161.  A 12-lead EKG has revealed sinus tachycardia at 99 beats per minute, normal PR and QRS interval. No acute ST-T wave changes.   ASSESSMENT AND PLAN: A 44 year old male presenting to the ER with the chief complaint of shortness of breath, productive cough with greenish-yellow phlegm, left-sided pleuritic chest pain with coughing. Will be admitted with following assessment and plan:  1.  Pneumonia, probably healthcare-associated. The patient was treated with outpatient antibiotics approximately 1 week ago with no significant improvement. We will provide him IV Zosyn and Levaquin. Blood cultures and sputum cultures were ordered.   2.  Pleuritic chest pain, probably from problem #1. Will provide him pain management as needed basis and if the parapneumonic effusions are not resolving will consider thoracentesis.  3.  Hypokalemia. We will provide IV fluids with potassium chloride.  4.  Left-sided thyroid mass. Will obtain thyroid ultrasound.  5.  Will provide gastrointestinal and deep vein thrombosis prophylaxis.   He is FULL code. Diagnosis and plan of care was discussed with the patient. He is aware of the plan.   TOTAL TIME SPENT ON ADMISSION: 45 minutes.    ____________________________ Ramonita LabAruna Shalinda Burkholder, MD ag:sb D: 07/23/2013 06:24:48 ET T: 07/23/2013 07:05:11 ET JOB#: [id number]  cc: Ramonita LabAruna Tilmon Wisehart, MD, <Dictator> Ramonita LabARUNA Dessa Ledee MD ELECTRONICALLY SIGNED 08/08/2013 4:48

## 2014-09-21 NOTE — Consult Note (Signed)
Chief Complaint:  Subjective/Chief Complaint Please see EGD report. No evidence of neoplasia, however healing  erosive esophagitis.  moderate sliding hiatal hernia.  Esophageal web noted, but open.  Continue bid protonix.  Soft diet for 6 weeks.  Repeat EGD in 6-8 weeks.  I will need to see patient in the o/p clinic in about 3 weeks.   Electronic Signatures: Barnetta ChapelSkulskie, Martin (MD)  (Signed 05-Mar-15 11:51)  Authored: Chief Complaint   Last Updated: 05-Mar-15 11:51 by Barnetta ChapelSkulskie, Martin (MD)

## 2014-09-21 NOTE — Discharge Summary (Signed)
PATIENT NAME:  Evan Edwards, [name] MR#:  [id number] DATE OF BIRTH:  [birth year]  ADDENDUM  This is an addendum to earlier dictated interim discharge summary by Dr. Enedina FinnerSona Patel on 3rd March.  FINAL DISCHARGE DIAGNOSES:   1. Empyema due to pneumonia.  2. Esophagitis and chronic dysphagia.  3. Anemia.  4. Thyroid nodule, need to follow up in the clinic.   CONDITION ON DISCHARGE: Stable.   CODE STATUS: FULL CODE.   DISCHARGE MEDICATIONS: 1. Ertapenem 1 g IV every 24 hours for 2 weeks.  2. Acetaminophen and hydrocodone 325/5 mg oral tablet 1 to 4 times a day as needed for pain.  3. Ferrous sulfate 325 mg oral tablet 2 times a day.  4. Pantoprazole 40 mg delayed-release tablet 2 times a day.   The patient was discharged with PICC line and IV antibiotic and chest tube, so arrange for home health services and nurse.   DIET: Advised to have regular diet, mechanical soft for a few days.   ACTIVITY: As tolerated.   to follow within 1 to 2 weeks. Advised to follow with Dr. Thelma Bargeaks once a week every Thursday and follow with Dr. Sampson GoonFitzgerald in 2 weeks to discuss further antibiotic plan. Check CBC and CMP every week and communicate the result with Dr. Sampson GoonFitzgerald. Follow up with Dr. Tedd SiasSolum in endocrine clinic in 1 to 2 months for thyroid nodule. Follow up in GI clinic in 1 to 2 months  for esophagitis.   HOSPITAL COURSE AND STAY:  For earlier history of admission and hospital course up to 3rd March, please see interim discharge summary done by Dr. Jearl KlinefelterSona on 3rd March. Hospital course after that: Because of the patient's dysphagia complaint, initially the plan was to do modified barium swallow, which was not done because of abscess issues and GI decided to do endoscopy. 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 for 6 weeks and soft diet for 8 weeks and follow up. Chest tube was  already placed and was having minimal discharge from that. Advised to discharge with chest tube with arrangement of home health nurse and IV antibiotic through PICC line with followups with Dr. Sampson GoonFitzgerald and Dr. Thelma Bargeaks' office.  TOTAL TIME SPENT ON THIS DISCHARGE: 40 minutes.   Please see interim discharge summary done earlier.   ____________________________ Hope PigeonVaibhavkumar G. Elisabeth PigeonVachhani, MD vgv:sg D: 08/06/2013 08:04:00 ET T: 08/06/2013 11:08:29 ET JOB#: [id number]  cc: Marcial Pacasimothy E. Thelma Bargeaks, MD Ezzard StandingPaul Y. Bluford Kaufmannh, MD Stann Mainlandavid P. Sampson GoonFitzgerald, MD Christena DeemMartin U. Skulskie, MD A. Wendall MolaMelissa Solum, MD Hope PigeonVaibhavkumar G. Elisabeth PigeonVachhani, MD, <Dictator>     Altamese DillingVAIBHAVKUMAR Yordan Martindale MD ELECTRONICALLY SIGNED 08/14/2013 22:43

## 2014-09-21 NOTE — Consult Note (Signed)
VITAL SIGNS/ANCILLARY NOTES: **Vital Signs.:   27-Feb-15 00:00  Pulse Pulse 92    01:15  Pulse Pulse 92    02:15  Pulse Pulse 92    03:15  Pulse Pulse 88    04:15  Pulse Pulse 86    05:15  Pulse Pulse 96    06:15  Pulse Pulse 92    08:00  Pulse Pulse 90    09:00  Pulse Pulse 98    10:40  Pulse Pulse 98    12:00  Pulse Pulse 96    13:57  Pulse Pulse 106    14:08  Vital Signs Type Routine  Temperature Temperature (F) 97.9  Celsius 36.6  Temperature Source oral  Pulse Pulse 120  Respirations Respirations 20  Systolic BP Systolic BP 161  Diastolic BP (mmHg) Diastolic BP (mmHg) 83  Mean BP 102  Pulse Ox % Pulse Ox % 91  Pulse Ox Activity Level  At rest  Oxygen Delivery 5L   Brief Assessment:  Cardiac Regular   Respiratory normal resp effort  right cta, left dull, positive breath soounds.   Gastrointestinal details normal Soft  Nontender  Bowel sounds normal  mild distension   Lab Results: Routine Chem:  27-Feb-15 11:11   Creatinine (comp) 0.71  eGFR (African American) >60  eGFR (Non-African American) >60 (eGFR values <64m/min/1.73 m2 may be an indication of chronic kidney disease (CKD). Calculated eGFR is useful in patients with stable renal function. The eGFR calculation will not be reliable in acutely ill patients when serum creatinine is changing rapidly. It is not useful in  patients on dialysis. The eGFR calculation may not be applicable to patients at the low and high extremes of body sizes, pregnant women, and vegetarians.)  Routine Sero:  27-Feb-15 14:46   Rapid HIV 1/2 Ab Test with Confirmation ([facility]) NEG - HIV 1/2 AB This is a screening test for the presence of HIV-1/2 antibodies. False-negative and false-positive results can occur. All preliminary positive samples are sent for confirmatory testing. Clinical correlation is necessary to assess whether repeat testing may be needed for negative results.   Radiology Results: CT:    26-Feb-15  07:47, CT Chest With Contrast  CT Chest With Contrast   REASON FOR EXAM:    assess for pleural effusion  COMMENTS:       PROCEDURE: CT  - CT CHEST WITH CONTRAST  - Jul 26 2013  7:47AM     CLINICAL DATA:  Pleural effusion    EXAM:  CT CHEST WITH CONTRAST    TECHNIQUE:  Multidetector CT imaging of the chestwas performed following the  standard protocol without IV contrast..    COMPARISON:  CT ANGIO CHEST dated 07/23/2013  FINDINGS:  Pigtail left thoracostomy has been placed via fifth intercostal  anterior axillary line approach. It is coiled in the lateral hemi  thorax. The left pleural effusion is improved. A loculated basilar  pleural effusion probably separate from the localized area of the  chest tube remains at the left base posteriorly. There is also a  small amount of pleural effusion in the anterior left base, adjacent  to the left heart border. There is pleural fluid posterior to the  left upper lobe on image 27 which may be free-flowing. No  pneumothorax.    Overall, heterogeneous opacities throughout the left lower lobe and  lingula are not significantly changed. No evidence of airway  obstruction or obvious endobronchial lesion. Ground-glass opacities  in the left upper lobe improved. Linear  opacities within the left  upper lobe likely a combination of volume loss and interlobular  septal thickening are increased.    Linear and central right airspace opacities increased. Small right  pleural effusion has developed.    Stable left thyroid mass    Stable mediastinal adenopathy.    Diffuse esophageal wall thickening has improved.    Small pericardial effusion is present and slightly worse.    No acute bony deformity.   IMPRESSION:  Left chest tube placed with improvement in the left pleural  effusion. It is positioned in the left upper lateral hemi thorax.    Persistent loculated pleural effusion at the left base which is  probably separate from the location  of the left chest tube. Other  areas of pleural fluid as described above.    Pulmonary parenchymal changes throughout the left lung are not  significantly changed. No endobronchial lesion.    New airspace opacities within the right lung with a central  distribution. Small right pleural effusion has developed    Very small pericardial effusion has probably increased.  Stable mediastinal adenopathy.    Improved esophageal wall thickening.    Overall, findings remain worrisome for an inflammatory process,  especially opportunistic infection in an immunocompromised patient.  Correlation with immune status is recommended. Underlying malignancy  is not excluded.      Electronically Signed    By: Maryclare Bean M.D.    On: 07/26/2013 07:57       Verified By: Jamas Lav, M.D.,   Assessment/Plan:  Assessment/Plan:  Assessment 1) abnormal CT noting thickening of the esophagus diffusely.  improved on serial ct over course of hospitalization.  Patient has denies dysphagia, though long h/o severe GERD, often not appropriately treated.  2) left PNA and pleural effusion, s/p VATS and decortication yesterday. 3) mild abdominal distension.  relatively normal bs, however no bm recorded for several days. also on doses of pain meds.   Plan 1) continue bid iv ppi until early in the week, then change to po bid.   Will need egd as o/p in about 4 -6 weeks depending on clinical situation.  If he starts having dysphagia would do a barium swallow. 2) will start daily dose of miralax, recommend 2 way abd film tomorrow if no improvement or bm.   I will ask Dr Candace Cruise to see over the weekend.  I will be back on monday.   Electronic Signatures: Loistine Simas (MD)  (Signed 27-Feb-15 16:54)  Authored: VITAL SIGNS/ANCILLARY NOTES, Brief Assessment, Lab Results, Radiology Results, Assessment/Plan   Last Updated: 27-Feb-15 16:54 by Loistine Simas (MD)

## 2015-01-10 ENCOUNTER — Ambulatory Visit: Payer: BLUE CROSS/BLUE SHIELD | Attending: Specialist

## 2015-01-10 DIAGNOSIS — R0683 Snoring: Secondary | ICD-10-CM | POA: Diagnosis not present

## 2015-01-10 DIAGNOSIS — G4761 Periodic limb movement disorder: Secondary | ICD-10-CM | POA: Insufficient documentation

## 2015-01-10 DIAGNOSIS — G4730 Sleep apnea, unspecified: Secondary | ICD-10-CM | POA: Diagnosis present

## 2015-01-10 DIAGNOSIS — G4733 Obstructive sleep apnea (adult) (pediatric): Secondary | ICD-10-CM | POA: Diagnosis not present

## 2015-02-14 ENCOUNTER — Ambulatory Visit: Payer: BLUE CROSS/BLUE SHIELD | Attending: Specialist

## 2015-02-14 DIAGNOSIS — G4733 Obstructive sleep apnea (adult) (pediatric): Secondary | ICD-10-CM | POA: Diagnosis not present

## 2015-07-20 ENCOUNTER — Emergency Department: Payer: Worker's Compensation

## 2015-07-20 ENCOUNTER — Emergency Department
Admission: EM | Admit: 2015-07-20 | Discharge: 2015-07-20 | Disposition: A | Discharge status: Home / Self Care | Payer: Worker's Compensation | Attending: Emergency Medicine | Admitting: Emergency Medicine

## 2015-07-20 ENCOUNTER — Encounter: Payer: Self-pay | Admitting: Emergency Medicine

## 2015-07-20 DIAGNOSIS — S99922A Unspecified injury of left foot, initial encounter: Secondary | ICD-10-CM | POA: Diagnosis present

## 2015-07-20 DIAGNOSIS — S92315A Nondisplaced fracture of first metatarsal bone, left foot, initial encounter for closed fracture: Secondary | ICD-10-CM | POA: Diagnosis not present

## 2015-07-20 DIAGNOSIS — S92325A Nondisplaced fracture of second metatarsal bone, left foot, initial encounter for closed fracture: Secondary | ICD-10-CM | POA: Diagnosis not present

## 2015-07-20 DIAGNOSIS — Y9389 Activity, other specified: Secondary | ICD-10-CM | POA: Insufficient documentation

## 2015-07-20 DIAGNOSIS — Z87891 Personal history of nicotine dependence: Secondary | ICD-10-CM | POA: Insufficient documentation

## 2015-07-20 DIAGNOSIS — Y9289 Other specified places as the place of occurrence of the external cause: Secondary | ICD-10-CM | POA: Diagnosis not present

## 2015-07-20 DIAGNOSIS — W010XXA Fall on same level from slipping, tripping and stumbling without subsequent striking against object, initial encounter: Secondary | ICD-10-CM | POA: Diagnosis not present

## 2015-07-20 DIAGNOSIS — S8012XA Contusion of left lower leg, initial encounter: Secondary | ICD-10-CM | POA: Insufficient documentation

## 2015-07-20 DIAGNOSIS — Z79899 Other long term (current) drug therapy: Secondary | ICD-10-CM | POA: Diagnosis not present

## 2015-07-20 DIAGNOSIS — Y990 Civilian activity done for income or pay: Secondary | ICD-10-CM | POA: Diagnosis not present

## 2015-07-20 HISTORY — DX: Unspecified bacterial pneumonia: J15.9

## 2015-07-20 NOTE — ED Notes (Signed)
Pt states he turned his left foot last night in a drain at work at [employer]. Difficulty ambulating this morning. Took a hydrocodone around 500am.

## 2015-07-20 NOTE — ED Notes (Signed)
Pt states at approx 1030 last night he was at work at [employer] when he twisted his L foot and fell. Pt states he woke up this morning and could not walk. Pt presents as worker's comp. Pt states last night he did drink 25oz of beer and had 1 vicodin pill.

## 2015-07-20 NOTE — Discharge Instructions (Signed)
Please wear hard sole shoe which you were provided at all times when not in bed.  Follow-up closely with orthopedics Dr. Hyacinth Meeker. Return to emergency room if numbness, weakness, severe pain, cold or blue foot, fever, increased swelling or other new concerns arise.  You are to use crutches and not place weight upon the left foot until cleared to do so by orthopedics.  Metatarsal Fracture A metatarsal fracture is a break in a metatarsal bone. Metatarsal bones connect your toe bones to your ankle bones. CAUSES This type of fracture may be caused by:  A sudden twisting of your foot.  A fall onto your foot.  Overuse or repetitive exercise. RISK FACTORS This condition is more likely to develop in people who:  Play contact sports.  Have a bone disease.  Have a low calcium level. SYMPTOMS Symptoms of this condition include:  Pain that is worse when walking or standing.  Pain when pressing on the foot or moving the toes.  Swelling.  Bruising on the top or bottom of the foot.  A foot that appears shorter than the other one. DIAGNOSIS This condition is diagnosed with a physical exam. You may also have imaging tests, such as:  X-rays.  A CT scan.  MRI. TREATMENT Treatment for this condition depends on its severity and whether a bone has moved out of place. Treatment may involve:  Rest.  Wearing foot support such as a cast, splint, or boot for several weeks.  Using crutches.  Surgery to move bones back into the right position. Surgery is usually needed if there are many pieces of broken bone or bones that are very out of place (displaced fracture).  Physical therapy. This may be needed to help you regain full movement and strength in your foot. You will need to return to your health care provider to have X-rays taken until your bones heal. Your health care provider will look at the X-rays to make sure that your foot is healing well. HOME CARE INSTRUCTIONS  If You Have a  Cast:  Do not stick anything inside the cast to scratch your skin. Doing that increases your risk of infection.  Check the skin around the cast every day. Report any concerns to your health care provider. You may put lotion on dry skin around the edges of the cast. Do not apply lotion to the skin underneath the cast.  Keep the cast clean and dry. If You Have a Splint or a Supportive Boot:  Wear it as directed by your health care provider. Remove it only as directed by your health care provider.  Loosen it if your toes become numb and tingle, or if they turn cold and blue.  Keep it clean and dry. Bathing  Do not take baths, swim, or use a hot tub until your health care provider approves. Ask your health care provider if you can take showers. You may only be allowed to take sponge baths for bathing.  If your health care provider approves bathing and showering, cover the cast or splint with a watertight plastic bag to protect it from water. Do not let the cast or splint get wet. Managing Pain, Stiffness, and Swelling  If directed, apply ice to the injured area (if you have a splint, not a cast).  Put ice in a plastic bag.  Place a towel between your skin and the bag.  Leave the ice on for 20 minutes, 2-3 times per day.  Move your toes often to avoid  stiffness and to lessen swelling.  Raise (elevate) the injured area above the level of your heart while you are sitting or lying down. Driving  Do not drive or operate heavy machinery while taking pain medicine.  Do not drive while wearing foot support on a foot that you use for driving. Activity  Return to your normal activities as directed by your health care provider. Ask your health care provider what activities are safe for you.  Perform exercises as directed by your health care provider or physical therapist. Safety  Do not use the injured foot to support your body weight until your health care provider says that you can.  Use crutches as directed by your health care provider. General Instructions  Do not put pressure on any part of the cast or splint until it is fully hardened. This may take several hours.  Do not use any tobacco products, including cigarettes, chewing tobacco, or e-cigarettes. Tobacco can delay bone healing. If you need help quitting, ask your health care provider.  Take medicines only as directed by your health care provider.  Keep all follow-up visits as directed by your health care provider. This is important. SEEK MEDICAL CARE IF:  You have a fever.  Your cast, splint, or boot is too loose or too tight.  Your cast, splint, or boot is damaged.  Your pain medicine is not helping.  You have pain, tingling, or numbness in your foot that is not going away. SEEK IMMEDIATE MEDICAL CARE IF:  You have severe pain.  You have tingling or numbness in your foot that is getting worse.  Your foot feels cold or becomes numb.  Your foot changes color.   This information is not intended to replace advice given to you by your health care provider. Make sure you discuss any questions you have with your health care provider.   Document Released: 02/06/2002 Document Revised: 10/01/2014 Document Reviewed: 03/13/2014 [employer] Interactive Patient Education [employer]

## 2015-07-20 NOTE — ED Provider Notes (Signed)
[facility] Emergency Department Provider Note  ____________________________________________  Time seen: Approximately 10:04 AM  I have reviewed the triage vital signs and the nursing notes.   HISTORY  Chief Complaint Foot Pain    HPI Evan Edwards is a 45 y.o. male reports a previous history of severe pneumonia.  Patient reports that he was working yesterday when his foot got caught in a drain and he fell forward.He injured the left foot. He fell forward but did not injure anything else. He's had swelling and pain over the middle of his left foot since. Denies ankle pain. He has continued to walk upon the leg noticed very sore. He took a Vicodin which she has previously prescribed for pain last night.  No numbness tingling or weakness. He does have swelling across the top of the foot though. Denies any trouble wiggling or moving the toes.  No other injury. No pain in the knee, ankle, hip. Did not fall and strike his head or otherwise.   Past Medical History  Diagnosis Date  . Bacterial pneumonia     There are no active problems to display for this patient.   Past Surgical History  Procedure Laterality Date  . Bacterial pneumonia surgery      Pt unable to state what exactly was done, states "they went inside of removed it".    Current Outpatient Rx  Name  Route  Sig  Dispense  Refill  . DEXILANT 60 MG capsule   Oral   Take 60 mg by mouth daily.      9     Dispense as written.   . ferrous sulfate 325 (65 FE) MG tablet   Oral   Take 325 mg by mouth daily.      11   . fluconazole (DIFLUCAN) 200 MG tablet   Oral   Take 200 mg by mouth once a week. On Sunday.      1   . PROAIR HFA 108 (90 Base) MCG/ACT inhaler   Inhalation   Inhale 2 puffs into the lungs every 6 (six) hours as needed. For wheezing.      5     Dispense as written.   . SYMBICORT 160-4.5 MCG/ACT inhaler   Inhalation   Inhale 2 puffs into the lungs 2 (two)  times daily.      11      Dispense as written.     Allergies Review of patient's allergies indicates no known allergies.  History reviewed. No pertinent family history.  Social History Social History  Substance Use Topics  . Smoking status: Former Games developer  . Smokeless tobacco: Former Neurosurgeon  . Alcohol Use: 2.4 oz/week    4 Cans of beer per week    Review of Systems Constitutional: No fever/chills Eyes: No visual changes. ENT: No sore throat. Cardiovascular: Denies chest pain. Respiratory: Denies shortness of breath. Gastrointestinal: No abdominal pain.  No nausea, no vomiting.  No diarrhea.  No constipation. Genitourinary: Negative for dysuria. Musculoskeletal: Negative for back pain. Skin: Negative for rash. Neurological: Negative for headaches, focal weakness or numbness.  10-point ROS otherwise negative.  ____________________________________________   PHYSICAL EXAM:  VITAL SIGNS: ED Triage Vitals  Enc Vitals Group     BP 07/20/15 0706 126/83 mmHg     Pulse Rate 07/20/15 0706 75     Resp 07/20/15 0706 18     Temp 07/20/15 0706 97.6 F (36.4 C)     Temp Source 07/20/15 0706 Oral  SpO2 07/20/15 0706 95 %     Weight 07/20/15 0706 207 lb (93.895 kg)     Height 07/20/15 0706  (1.702 m)     Head Cir --      Peak Flow --      Pain Score 07/20/15 0707 9     Pain Loc --      Pain Edu? --      Excl. in GC? --    Constitutional: Alert and oriented. Well appearing and in no acute distress. Eyes: Conjunctivae are normal. PERRL. EOMI. Head: Atraumatic. Nose: No congestion/rhinnorhea. Mouth/Throat: Mucous membranes are moist.  Oropharynx non-erythematous. Neck: No stridor.   Cardiovascular: Normal rate, regular rhythm. Normal dorsalis pedis pulses and posterior tibial pulses with normal capillary refill in the feet bilateral.  Respiratory: Normal respiratory effort.  No retractions. Lungs CTAB. Gastrointestinal: Soft and nontender. No distention. No  abdominal bruits. No CVA tenderness. Musculoskeletal:   Lower Extremities  No edema. Normal DP/PT pulses bilateral with good cap refill.  Normal neuro-motor function lower extremities bilateral.  RIGHT Right lower extremity demonstrates normal strength, good use of all muscles. No edema bruising or contusions of the right hip, right knee, right ankle.     LEFT Left lower extremity demonstrates normal strength, good use of all muscles. No edema bruising or contusions of the hip,  knee, ankle. Full range of motion of the left lower extremity without pain. No pain on axial loading. No evidence of trauma.  The LEFT foot demonstrates tenderness across the bridge of the foot, primarily over the medial aspect. There is slight ecchymosis without erythema. There is minimal edema. Patient has focal tenderness over the midfoot. There is no loss of sensation. Normal toe wiggle. The ankle is nontender and not swollen. No tenderness over the malleoli.    Neurologic:  Normal speech and language. No gross focal neurologic deficits are appreciated. No gait instability. Skin:  Skin is warm, dry and intact. No rash noted. Psychiatric: Mood and affect are normal. Speech and behavior are normal.  ____________________________________________   LABS (all labs ordered are listed, but only abnormal results are displayed)  Labs Reviewed - No data to display ____________________________________________  EKG   ____________________________________________  RADIOLOGY  DG Foot Complete Left (Final result) Result time: 07/20/15 08:55:09   Final result by Rad Results In Interface (07/20/15 08:55:09)   Narrative:   CLINICAL DATA: Fall  EXAM: LEFT FOOT - COMPLETE 3+ VIEW  COMPARISON: None.  FINDINGS: There is a small fracture fragment at the lateral first metatarsal base. There is also a similar small fracture fragment at the medial first metatarsal base seen on the oblique view. There is an  a overlapping bony structure at the base of the second metatarsal. This may represent a third fracture. These fractures are in close proximity to Lisfranc's joint and Lisfranc injury is not excluded.  IMPRESSION: There are fractures at the base of the first and second metatarsal as described. CT may be helpful to further characterize anatomical structures.   Electronically Signed By: Jolaine Click M.D. On: 07/20/2015 08:55       ____________________________________________   PROCEDURES  Procedure(s) performed: None  Critical Care performed: No  ____________________________________________   INITIAL IMPRESSION / ASSESSMENT AND PLAN / ED COURSE  Pertinent labs & imaging results that were available during my care of the patient were reviewed by me and considered in my medical decision making (see chart for details).  Patient tenses isolated LEFT foot injury. Focal tenderness corresponds  well with x-ray which demonstrates first and second metatarsal fracture. Discussed case and care and clinical exam and x-rays with Dr. Deeann Saint, he advises [place] postop shoe, crutches and close follow-up with orthopedics. Patient will be nonweightbearing on the left foot.  Careful return precautions, nonweightbearing status, and follow-up with orthopedics discussed with the patient who is agreeable. ____________________________________________   FINAL CLINICAL IMPRESSION(S) / ED DIAGNOSES  Final diagnoses:  Closed nondisplaced fracture of first left metatarsal bone, initial encounter   Closed, nondisplaced fracture of the second left metatarsal, initial encounter.   Sharyn Creamer, MD 07/20/15 1009

## 2017-12-22 ENCOUNTER — Ambulatory Visit: Payer: Self-pay | Admitting: Surgery

## 2017-12-22 NOTE — H&P (Signed)
Progress Notes by Sung Amabile, DO at 12/14/2017 8:45 AM   Author: Sung Amabile, DO Service: - Author Type: Physician  Filed: 12/14/2017 3:16 PM Encounter Date: 12/14/2017 Note Type: Progress Notes  Editor: Sung Amabile, DO (Physician)     Show:Clear all [x] Manual[x] Template[] Copied  Added by: [x] Tonna Boehringer, Mariona Scholes, DO  [] Hover for details Subjective:   CC: umbilical hernia  HPI:  Evan Edwards is a 47 y.o. male who was referred by Enzo Montgomery, MD for evaluation of umbilical and possible ventral  hernias. Umbilical hernia symptoms were first noted 2 months ago. Pain is sharp and intermittent, confined to the area, without radiation.  Associated with nothing specific, exacerbated by nothing specific. Lump self reduces when it is prominent.  Ventral hernia Symptoms were first noted several years ago. Asymptomatic but Lump is not reducible.  Patient has no symptoms of chronic constipation or urinary retention.    Past Medical History:  has a past medical history of Beta thalassemia (CMS-HCC) (01/02/08), Chronic upper GI bleeding, unspecified, Diverticulosis, Emphysema, unspecified (CMS-HCC), GERD (gastroesophageal reflux disease), Iron deficiency anemia, and Recurrent pneumonia, unspecified.  He also reports CPAP use at night for OSA.  Past Surgical History:       Past Surgical History:  Procedure Laterality Date  . APPENDECTOMY  1997  . EGD  01/26/12; 08/02/13    Family History: family history includes Myocardial Infarction (Heart attack) in his father.  Social History:  reports that he has never smoked. He quit smokeless tobacco use about 4 years ago. His smokeless tobacco use included chew. He reports that he drinks about 15.6 oz of alcohol per week. He reports that he does not use drugs.  Current Medications: has a current medication list which includes the following prescription(s): albuterol, cetirizine, dexlansoprazole, loratadine, naproxen, and terbinafine  hcl.  Allergies:     Allergies as of 12/14/2017  . (No Known Allergies)    ROS:  A 15 point review of systems was performed and pertinent positives and negatives noted in HPI   Objective:   BP 143/86   Pulse 89   Temp 36.7 C (98.1 F) (Oral)   Ht 170.2 cm (5\' 7" )   Wt (!) 105.2 kg (232 lb)   BMI 36.34 kg/m   Constitutional :  alert, appears stated age, cooperative and no distress  Lymphatics/Throat:  no asymmetry, masses, or scars  Respiratory:  clear to auscultation bilaterally  Cardiovascular:  regular rate and rhythm, S1, S2 normal, no murmur, click, rub or gallop  Gastrointestinal: soft, non-tender; bowel sounds normal; no masses,  no organomegaly and palpable umbilical hernia defect noted, measuring about 2cm in size.  Questionable lump on ventral aspect is hard, irreducbile, and continuous with xyphoid bone.  Bedside US performed by me shows increased echogencic structure with posterior shadowing continuous from the the sternum, consistent with a prominent xyphoid .   Musculoskeletal: Steady gait and movement  Skin: Cool and moist  Psychiatric: Normal affect, non-agitated, not confused       LABS:  -      Lab Results  Component Value Date   WBC 13.4 (H) 08/09/2017   HGB 13.3 (L) 08/09/2017   HCT 44.5 08/09/2017   PLT 239 08/09/2017   -      Lab Results  Component Value Date   NA 142 08/09/2017   K 4.2 08/09/2017   CL 103 08/09/2017   CO2 22.4 08/09/2017   BUN 7 08/09/2017   CREATININE 0.6 (L) 08/09/2017   GLUCOSE 108 08/09/2017   -  Lab Results  Component Value Date   NA 142 08/09/2017   K 4.2 08/09/2017   CL 103 08/09/2017   CO2 22.4 08/09/2017   BUN 7 08/09/2017   CREATININE 0.6 (L) 08/09/2017   CALCIUM 9.0 08/09/2017   TP 6.8 12/03/2011   ALB 3.9 08/09/2017   TBILI 0.3 08/09/2017   ALKPHOS 84 08/09/2017   AST 18 08/09/2017   ALT 16 08/09/2017   GLUCOSE 108 08/09/2017   GFR 144 08/09/2017      RADS: Bedside US as noted in PE. Assessment:       Umbilical hernia without obstruction and without gangrene [K42.9]   OSA GERD OBESITY  Plan:   1. Umbilical hernia without obstruction and without gangrene [K42.9]   Discussed the risk of surgery including recurrence, which can be up to 50% in the case of incisional or complex hernias, possible use of prosthetic materials (mesh) and the increased risk of mesh infxn if used, bleeding, chronic pain, post-op infxn, post-op SBO or ileus, and possible re-operation to address said risks. The risks of general anesthetic, if used, includes MI, CVA, sudden death or even reaction to anesthetic medications also discussed. Alternatives include continued observation.  Benefits include possible symptom relief, prevention of incarceration, strangulation, enlargement in size over time, and the risk of emergency surgery in the face of strangulation.   Typical post-op recovery time of 3-5 days with 4-6 weeks of activity restrictions were also discussed.  ED return precautions given for sudden increase in pain, size of hernia with accompanying fever, nausea, and/or vomiting.  The patient verbalized understanding and all questions were answered to the patient's satisfaction.   2. Patient has elected to proceed with surgical treatment. Procedure will be scheduled.  Written consent was obtained.  3.  OSA on CPAP - continue current management.  4. GERD -states recently switched to dexilant.  Updated in chart and recommend Continue present management.  5.  Obesity -discussed higher risk of recurrence and complications.  Pt understands and still wishes to proceed.      Electronically signed by Sung AmabileSakai, Darren Caldron, DO on 12/14/2017 3:16 PM

## 2017-12-22 NOTE — H&P (View-Only) (Signed)
Progress Notes by Jannet Calip, DO at 12/14/2017 8:45 AM   Author: Silveria Botz, DO Service: - Author Type: Physician  Filed: 12/14/2017 3:16 PM Encounter Date: 12/14/2017 Note Type: Progress Notes  Editor: Olvin Rohr, DO (Physician)     Show:Clear all [x]Manual[x]Template[]Copied  Added by: [x]Dshawn Mcnay, DO  []Hover for details Subjective:   CC: umbilical hernia  HPI:  Evan Edwards is a 47 y.o. male who was referred by John David Johnston, MD for evaluation of umbilical and possible ventral  hernias. Umbilical hernia symptoms were first noted 2 months ago. Pain is sharp and intermittent, confined to the area, without radiation.  Associated with nothing specific, exacerbated by nothing specific. Lump self reduces when it is prominent.  Ventral hernia Symptoms were first noted several years ago. Asymptomatic but Lump is not reducible.  Patient has no symptoms of chronic constipation or urinary retention.    Past Medical History:  has a past medical history of Beta thalassemia (CMS-HCC) (01/02/08), Chronic upper GI bleeding, unspecified, Diverticulosis, Emphysema, unspecified (CMS-HCC), GERD (gastroesophageal reflux disease), Iron deficiency anemia, and Recurrent pneumonia, unspecified.  He also reports CPAP use at night for OSA.  Past Surgical History:       Past Surgical History:  Procedure Laterality Date  . APPENDECTOMY  1997  . EGD  01/26/12; 08/02/13    Family History: family history includes Myocardial Infarction (Heart attack) in his father.  Social History:  reports that he has never smoked. He quit smokeless tobacco use about 4 years ago. His smokeless tobacco use included chew. He reports that he drinks about 15.6 oz of alcohol per week. He reports that he does not use drugs.  Current Medications: has a current medication list which includes the following prescription(s): albuterol, cetirizine, dexlansoprazole, loratadine, naproxen, and terbinafine  hcl.  Allergies:     Allergies as of 12/14/2017  . (No Known Allergies)    ROS:  A 15 point review of systems was performed and pertinent positives and negatives noted in HPI   Objective:   BP 143/86   Pulse 89   Temp 36.7 C (98.1 F) (Oral)   Ht 170.2 cm (5' 7")   Wt (!) 105.2 kg (232 lb)   BMI 36.34 kg/m   Constitutional :  alert, appears stated age, cooperative and no distress  Lymphatics/Throat:  no asymmetry, masses, or scars  Respiratory:  clear to auscultation bilaterally  Cardiovascular:  regular rate and rhythm, S1, S2 normal, no murmur, click, rub or gallop  Gastrointestinal: soft, non-tender; bowel sounds normal; no masses,  no organomegaly and palpable umbilical hernia defect noted, measuring about 2cm in size.  Questionable lump on ventral aspect is hard, irreducbile, and continuous with xyphoid bone.  Bedside US performed by me shows increased echogencic structure with posterior shadowing continuous from the the sternum, consistent with a prominent xyphoid .   Musculoskeletal: Steady gait and movement  Skin: Cool and moist  Psychiatric: Normal affect, non-agitated, not confused       LABS:  -      Lab Results  Component Value Date   WBC 13.4 (H) 08/09/2017   HGB 13.3 (L) 08/09/2017   HCT 44.5 08/09/2017   PLT 239 08/09/2017   -      Lab Results  Component Value Date   NA 142 08/09/2017   K 4.2 08/09/2017   CL 103 08/09/2017   CO2 22.4 08/09/2017   BUN 7 08/09/2017   CREATININE 0.6 (L) 08/09/2017   GLUCOSE 108 08/09/2017   -        Lab Results  Component Value Date   NA 142 08/09/2017   K 4.2 08/09/2017   CL 103 08/09/2017   CO2 22.4 08/09/2017   BUN 7 08/09/2017   CREATININE 0.6 (L) 08/09/2017   CALCIUM 9.0 08/09/2017   TP 6.8 12/03/2011   ALB 3.9 08/09/2017   TBILI 0.3 08/09/2017   ALKPHOS 84 08/09/2017   AST 18 08/09/2017   ALT 16 08/09/2017   GLUCOSE 108 08/09/2017   GFR 144 08/09/2017      RADS: Bedside US as noted in PE. Assessment:       Umbilical hernia without obstruction and without gangrene [K42.9]   OSA GERD OBESITY  Plan:   1. Umbilical hernia without obstruction and without gangrene [K42.9]   Discussed the risk of surgery including recurrence, which can be up to 50% in the case of incisional or complex hernias, possible use of prosthetic materials (mesh) and the increased risk of mesh infxn if used, bleeding, chronic pain, post-op infxn, post-op SBO or ileus, and possible re-operation to address said risks. The risks of general anesthetic, if used, includes MI, CVA, sudden death or even reaction to anesthetic medications also discussed. Alternatives include continued observation.  Benefits include possible symptom relief, prevention of incarceration, strangulation, enlargement in size over time, and the risk of emergency surgery in the face of strangulation.   Typical post-op recovery time of 3-5 days with 4-6 weeks of activity restrictions were also discussed.  ED return precautions given for sudden increase in pain, size of hernia with accompanying fever, nausea, and/or vomiting.  The patient verbalized understanding and all questions were answered to the patient's satisfaction.   2. Patient has elected to proceed with surgical treatment. Procedure will be scheduled.  Written consent was obtained.  3.  OSA on CPAP - continue current management.  4. GERD -states recently switched to dexilant.  Updated in chart and recommend Continue present management.  5.  Obesity -discussed higher risk of recurrence and complications.  Pt understands and still wishes to proceed.      Electronically signed by Rinnah Peppel, DO on 12/14/2017 3:16 PM     

## 2017-12-29 ENCOUNTER — Encounter
Admission: RE | Admit: 2017-12-29 | Discharge: 2017-12-29 | Disposition: A | Discharge status: Home / Self Care | Payer: 59 | Source: Ambulatory Visit | Attending: Surgery | Admitting: Surgery

## 2017-12-29 ENCOUNTER — Other Ambulatory Visit: Payer: Self-pay

## 2017-12-29 HISTORY — DX: Emphysema, unspecified: J43.9

## 2017-12-29 HISTORY — DX: Sleep apnea, unspecified: G47.30

## 2017-12-29 HISTORY — DX: Unspecified asthma, uncomplicated: J45.909

## 2017-12-29 HISTORY — DX: Beta thalassemia: D56.1

## 2017-12-29 HISTORY — DX: Gastro-esophageal reflux disease without esophagitis: K21.9

## 2017-12-29 HISTORY — DX: Anemia, unspecified: D64.9

## 2017-12-29 NOTE — Patient Instructions (Signed)
Your procedure is scheduled on: 01-05-18 THURSDAY Report to Same Day Surgery 2nd floor medical mall [facility] Entrance-take elevator on left to 2nd floor.  Check in with surgery information desk.) To find out your arrival time please call [phone] between 1PM - 3PM on 01-04-18 [facility]   Remember: Instructions that are not followed completely may result in serious medical risk, up to and including death, or upon the discretion of your surgeon and anesthesiologist your surgery may need to be rescheduled.    _x___ 1. Do not eat food after midnight the night before your procedure. NO GUM OR CANDY AFTER MIDNIGHT.  You may drink clear liquids up to 2 hours before you are scheduled to arrive at the hospital for your procedure.  Do not drink clear liquids within 2 hours of your scheduled arrival to the hospital.  Clear liquids include  --Water or Apple juice without pulp  --Clear carbohydrate beverage such as ClearFast or Gatorade  --Black Coffee or Clear Tea (No milk, no creamers, do not add anything to the coffee or Tea    ____Ensure clear carbohydrate drink on the way to the hospital for bariatric patients  ____Ensure clear carbohydrate drink 3 hours before surgery for Dr Rutherford Nail patients if physician instructed.   No gum chewing or hard candies.     __x__ 2. No Alcohol for 24 hours before or after surgery.   __x__3. No Smoking or e-cigarettes for 24 prior to surgery.  Do not use any chewable tobacco products for at least 6 hour prior to surgery   ____  4. Bring all medications with you on the day of surgery if instructed.    __x__ 5. Notify your doctor if there is any change in your medical condition     (cold, fever, infections).    x___6. On the morning of surgery brush your teeth with toothpaste and water.  You may rinse your mouth with mouth wash if you wish.  Do not swallow any toothpaste or mouthwash.   Do not wear jewelry, make-up, hairpins, clips or nail polish.  Do  not wear lotions, powders, or perfumes. You may wear deodorant.  Do not shave 48 hours prior to surgery. Men may shave face and neck.  Do not bring valuables to the hospital.    [facility] is not responsible for any belongings or valuables.               Contacts, dentures or bridgework may not be worn into surgery.  Leave your suitcase in the car. After surgery it may be brought to your room.  For patients admitted to the hospital, discharge time is determined by your treatment team.  _  Patients discharged the day of surgery will not be allowed to drive home.  You will need someone to drive you home and stay with you the night of your procedure.    Please read over the following fact sheets that you were given:   [facility] Preparing for Surgery   _x___ TAKE THE FOLLOWING MEDICATION THE MORNING OF SURGERY WITH A SMALL SIP OF WATER. These include:  1. ZYRTEC  2. NEXIUM  3. TAKE AN EXTRA NEXIUM THE NIGHT BEFORE YOUR SURGERY  4.  5.  6.  ____Fleets enema or Magnesium Citrate as directed.   _x___ Use CHG Soap or sage wipes as directed on instruction sheet   _X___ Use inhalers on the day of surgery and bring to hospital day of surgery-USE YOUR SYMBICORT AND PRO  AIR DAY OF SURGERY AND BRING PRO AIR TO HOSPITAL  ____ Stop Metformin and Janumet 2 days prior to surgery.    ____ Take 1/2 of usual insulin dose the night before surgery and none on the morning surgery.   ____ Follow recommendations from Cardiologist, Pulmonologist or PCP regarding stopping Aspirin, Coumadin, Plavix ,Eliquis, Effient, or Pradaxa, and Pletal.  X____Stop Anti-inflammatories such as Advil, Aleve, Ibuprofen, Motrin, Naproxen, Naprosyn, Goodies powders or aspirin products 5 DAYS PRIOR TO SURGERY-OK to take Tylenol    ____ Stop supplements until after surgery.     _X___ Bring C-Pap to the hospital.

## 2018-01-04 ENCOUNTER — Encounter
Admission: RE | Admit: 2018-01-04 | Discharge: 2018-01-04 | Disposition: A | Discharge status: Home / Self Care | Payer: 59 | Source: Ambulatory Visit | Attending: Surgery | Admitting: Surgery

## 2018-01-04 DIAGNOSIS — K420 Umbilical hernia with obstruction, without gangrene: Secondary | ICD-10-CM | POA: Diagnosis present

## 2018-01-04 DIAGNOSIS — E669 Obesity, unspecified: Secondary | ICD-10-CM | POA: Diagnosis not present

## 2018-01-04 DIAGNOSIS — G4733 Obstructive sleep apnea (adult) (pediatric): Secondary | ICD-10-CM | POA: Diagnosis not present

## 2018-01-04 DIAGNOSIS — J439 Emphysema, unspecified: Secondary | ICD-10-CM | POA: Diagnosis not present

## 2018-01-04 DIAGNOSIS — K219 Gastro-esophageal reflux disease without esophagitis: Secondary | ICD-10-CM | POA: Diagnosis not present

## 2018-01-04 DIAGNOSIS — Z79899 Other long term (current) drug therapy: Secondary | ICD-10-CM | POA: Diagnosis not present

## 2018-01-04 DIAGNOSIS — Z6836 Body mass index (BMI) 36.0-36.9, adult: Secondary | ICD-10-CM | POA: Diagnosis not present

## 2018-01-04 LAB — CBC
HCT: 34.8 % — ABNORMAL LOW (ref 40.0–52.0)
Hemoglobin: 10.7 g/dL — ABNORMAL LOW (ref 13.0–18.0)
MCH: 19.6 pg — ABNORMAL LOW (ref 26.0–34.0)
MCHC: 30.7 g/dL — AB (ref 32.0–36.0)
MCV: 63.9 fL — ABNORMAL LOW (ref 80.0–100.0)
PLATELETS: 193 10*3/uL (ref 150–440)
RBC: 5.45 MIL/uL (ref 4.40–5.90)
RDW: 21 % — AB (ref 11.5–14.5)
WBC: 7.9 10*3/uL (ref 3.8–10.6)

## 2018-01-04 LAB — BASIC METABOLIC PANEL
Anion gap: 9 (ref 5–15)
BUN: 8 mg/dL (ref 6–20)
CO2: 23 mmol/L (ref 22–32)
CREATININE: 0.74 mg/dL (ref 0.61–1.24)
Calcium: 8.6 mg/dL — ABNORMAL LOW (ref 8.9–10.3)
Chloride: 107 mmol/L (ref 98–111)
GFR calc Af Amer: >60 mL/min (ref >60)
GFR calc non Af Amer: >60 mL/min (ref >60)
GLUCOSE: 142 mg/dL — AB (ref 70–99)
Potassium: 4 mmol/L (ref 3.5–5.1)
Sodium: 139 mmol/L (ref 135–145)

## 2018-01-04 MED ORDER — CEFAZOLIN SODIUM-DEXTROSE 2-4 GM/100ML-% IV SOLN
2.0000 g | INTRAVENOUS | Status: AC
  Administered 2018-01-05: 2 g via INTRAVENOUS

## 2018-01-05 ENCOUNTER — Encounter: Payer: Self-pay | Admitting: *Deleted

## 2018-01-05 ENCOUNTER — Ambulatory Visit: Payer: 59 | Admitting: Certified Registered Nurse Anesthetist

## 2018-01-05 ENCOUNTER — Ambulatory Visit: Payer: Self-pay | Admitting: Surgery

## 2018-01-05 ENCOUNTER — Encounter
Admission: RE | Disposition: A | Discharge status: Home / Self Care | Payer: Self-pay | Source: Ambulatory Visit | Attending: Surgery

## 2018-01-05 ENCOUNTER — Other Ambulatory Visit: Payer: Self-pay

## 2018-01-05 ENCOUNTER — Ambulatory Visit
Admission: RE | Admit: 2018-01-05 | Discharge: 2018-01-05 | Disposition: A | Discharge status: Home / Self Care | Payer: 59 | Source: Ambulatory Visit | Attending: Surgery | Admitting: Surgery

## 2018-01-05 DIAGNOSIS — K420 Umbilical hernia with obstruction, without gangrene: Secondary | ICD-10-CM | POA: Diagnosis not present

## 2018-01-05 DIAGNOSIS — K219 Gastro-esophageal reflux disease without esophagitis: Secondary | ICD-10-CM | POA: Insufficient documentation

## 2018-01-05 DIAGNOSIS — E669 Obesity, unspecified: Secondary | ICD-10-CM | POA: Insufficient documentation

## 2018-01-05 DIAGNOSIS — Z6836 Body mass index (BMI) 36.0-36.9, adult: Secondary | ICD-10-CM | POA: Insufficient documentation

## 2018-01-05 DIAGNOSIS — J439 Emphysema, unspecified: Secondary | ICD-10-CM | POA: Insufficient documentation

## 2018-01-05 DIAGNOSIS — G4733 Obstructive sleep apnea (adult) (pediatric): Secondary | ICD-10-CM | POA: Insufficient documentation

## 2018-01-05 DIAGNOSIS — Z79899 Other long term (current) drug therapy: Secondary | ICD-10-CM | POA: Insufficient documentation

## 2018-01-05 HISTORY — PX: UMBILICAL HERNIA REPAIR: SHX196

## 2018-01-05 SURGERY — REPAIR, HERNIA, UMBILICAL, ADULT
Anesthesia: General | Wound class: "Clean "

## 2018-01-05 MED ORDER — ONDANSETRON HCL 4 MG/2ML IJ SOLN
INTRAMUSCULAR | Status: DC | PRN
  Administered 2018-01-05: 4 mg via INTRAVENOUS

## 2018-01-05 MED ORDER — MIDAZOLAM HCL 2 MG/2ML IJ SOLN
INTRAMUSCULAR | Status: AC
  Filled 2018-01-05: qty 2

## 2018-01-05 MED ORDER — ACETAMINOPHEN 500 MG PO TABS
ORAL_TABLET | ORAL | Status: AC
  Administered 2018-01-05: 1000 mg via ORAL
  Filled 2018-01-05: qty 2

## 2018-01-05 MED ORDER — LACTATED RINGERS IV SOLN
INTRAVENOUS | Status: DC
  Administered 2018-01-05: 07:00:00 via INTRAVENOUS

## 2018-01-05 MED ORDER — FENTANYL CITRATE (PF) 100 MCG/2ML IJ SOLN
INTRAMUSCULAR | Status: DC | PRN
  Administered 2018-01-05: 25 ug via INTRAVENOUS
  Administered 2018-01-05: 50 ug via INTRAVENOUS
  Administered 2018-01-05 (×7): 25 ug via INTRAVENOUS

## 2018-01-05 MED ORDER — IBUPROFEN 200 MG PO TABS: 800.0000 mg | ORAL_TABLET | Freq: Three times a day (TID) | ORAL | 0 refills | Status: DC

## 2018-01-05 MED ORDER — GLYCOPYRROLATE 0.2 MG/ML IJ SOLN
INTRAMUSCULAR | Status: AC
  Filled 2018-01-05: qty 1

## 2018-01-05 MED ORDER — MIDAZOLAM HCL 2 MG/2ML IJ SOLN
INTRAMUSCULAR | Status: DC | PRN
  Administered 2018-01-05: 2 mg via INTRAVENOUS

## 2018-01-05 MED ORDER — FENTANYL CITRATE (PF) 100 MCG/2ML IJ SOLN
INTRAMUSCULAR | Status: AC
  Filled 2018-01-05: qty 2

## 2018-01-05 MED ORDER — SUCCINYLCHOLINE CHLORIDE 20 MG/ML IJ SOLN
INTRAMUSCULAR | Status: AC
  Filled 2018-01-05: qty 1

## 2018-01-05 MED ORDER — DEXAMETHASONE SODIUM PHOSPHATE 10 MG/ML IJ SOLN
INTRAMUSCULAR | Status: AC
  Filled 2018-01-05: qty 1

## 2018-01-05 MED ORDER — ACETAMINOPHEN 500 MG PO TABS
1000.0000 mg | ORAL_TABLET | ORAL | Status: AC
  Administered 2018-01-05: 1000 mg via ORAL

## 2018-01-05 MED ORDER — CHLORHEXIDINE GLUCONATE CLOTH 2 % EX PADS: 6.0000 | MEDICATED_PAD | Freq: Once | CUTANEOUS | Status: DC

## 2018-01-05 MED ORDER — SODIUM CHLORIDE FLUSH 0.9 % IV SOLN
INTRAVENOUS | Status: AC
  Filled 2018-01-05: qty 50

## 2018-01-05 MED ORDER — DEXAMETHASONE SODIUM PHOSPHATE 10 MG/ML IJ SOLN
INTRAMUSCULAR | Status: DC | PRN
  Administered 2018-01-05: 10 mg via INTRAVENOUS

## 2018-01-05 MED ORDER — PROPOFOL 10 MG/ML IV BOLUS
INTRAVENOUS | Status: DC | PRN
  Administered 2018-01-05: 200 mg via INTRAVENOUS

## 2018-01-05 MED ORDER — BUPIVACAINE LIPOSOME 1.3 % IJ SUSP
INTRAMUSCULAR | Status: AC
  Filled 2018-01-05: qty 20

## 2018-01-05 MED ORDER — MIDAZOLAM HCL 2 MG/ML PO SYRP: 0.3000 mg/kg | ORAL_SOLUTION | Freq: Once | ORAL | Status: DC

## 2018-01-05 MED ORDER — PROMETHAZINE HCL 25 MG/ML IJ SOLN: 6.2500 mg | INTRAMUSCULAR | Status: DC | PRN

## 2018-01-05 MED ORDER — OXYCODONE HCL 5 MG PO TABS: 5.0000 mg | ORAL_TABLET | Freq: Once | ORAL | Status: DC | PRN

## 2018-01-05 MED ORDER — BUPIVACAINE-EPINEPHRINE (PF) 0.5% -1:200000 IJ SOLN
INTRAMUSCULAR | Status: AC
  Filled 2018-01-05: qty 30

## 2018-01-05 MED ORDER — DOCUSATE SODIUM 100 MG PO CAPS: 100.0000 mg | ORAL_CAPSULE | Freq: Two times a day (BID) | ORAL | 0 refills | Status: AC | PRN

## 2018-01-05 MED ORDER — BUPIVACAINE-EPINEPHRINE (PF) 0.5% -1:200000 IJ SOLN
INTRAMUSCULAR | Status: DC | PRN
  Administered 2018-01-05: 5 mL via PERINEURAL
  Administered 2018-01-05: 25 mL

## 2018-01-05 MED ORDER — LIDOCAINE HCL (PF) 2 % IJ SOLN
INTRAMUSCULAR | Status: AC
  Filled 2018-01-05: qty 10

## 2018-01-05 MED ORDER — PHENYLEPHRINE HCL 10 MG/ML IJ SOLN
INTRAMUSCULAR | Status: AC
  Filled 2018-01-05: qty 1

## 2018-01-05 MED ORDER — OXYCODONE HCL 5 MG/5ML PO SOLN: 5.0000 mg | Freq: Once | ORAL | Status: DC | PRN

## 2018-01-05 MED ORDER — MEPERIDINE HCL 50 MG/ML IJ SOLN: 6.2500 mg | INTRAMUSCULAR | Status: DC | PRN

## 2018-01-05 MED ORDER — KETOROLAC TROMETHAMINE 30 MG/ML IJ SOLN
INTRAMUSCULAR | Status: AC
  Filled 2018-01-05: qty 1

## 2018-01-05 MED ORDER — PROPOFOL 10 MG/ML IV BOLUS
INTRAVENOUS | Status: AC
  Filled 2018-01-05: qty 40

## 2018-01-05 MED ORDER — CEFAZOLIN SODIUM-DEXTROSE 2-4 GM/100ML-% IV SOLN
INTRAVENOUS | Status: AC
  Filled 2018-01-05: qty 100

## 2018-01-05 MED ORDER — FENTANYL CITRATE (PF) 100 MCG/2ML IJ SOLN: 25.0000 ug | INTRAMUSCULAR | Status: DC | PRN

## 2018-01-05 MED ORDER — LIDOCAINE HCL (CARDIAC) PF 100 MG/5ML IV SOSY
PREFILLED_SYRINGE | INTRAVENOUS | Status: DC | PRN
  Administered 2018-01-05: 100 mg via INTRAVENOUS

## 2018-01-05 MED ORDER — BUPIVACAINE LIPOSOME 1.3 % IJ SUSP
INTRAMUSCULAR | Status: DC | PRN
  Administered 2018-01-05: 20 mL

## 2018-01-05 MED ORDER — ROCURONIUM BROMIDE 50 MG/5ML IV SOLN
INTRAVENOUS | Status: AC
  Filled 2018-01-05: qty 1

## 2018-01-05 MED ORDER — ONDANSETRON HCL 4 MG/2ML IJ SOLN
INTRAMUSCULAR | Status: AC
  Filled 2018-01-05: qty 2

## 2018-01-05 MED ORDER — HYDROCODONE-ACETAMINOPHEN 5-325 MG PO TABS: 1.0000 | ORAL_TABLET | Freq: Four times a day (QID) | ORAL | 0 refills | Status: DC | PRN

## 2018-01-05 MED ORDER — ACETAMINOPHEN 10 MG/ML IV SOLN
INTRAVENOUS | Status: AC
Start: 2018-01-05 — End: ?
  Filled 2018-01-05: qty 100

## 2018-01-05 SURGICAL SUPPLY — 33 items
BLADE SURG 15 STRL LF DISP TIS (BLADE) ×1 IMPLANT
BLADE SURG 15 STRL SS (BLADE) ×2
CANISTER SUCT 1200ML W/VALVE (MISCELLANEOUS) ×3 IMPLANT
CHLORAPREP W/TINT 26ML (MISCELLANEOUS) ×3 IMPLANT
DERMABOND ADVANCED (GAUZE/BANDAGES/DRESSINGS) ×2
DERMABOND ADVANCED .7 DNX12 (GAUZE/BANDAGES/DRESSINGS) ×1 IMPLANT
DRAPE LAPAROTOMY 77X122 PED (DRAPES) ×3 IMPLANT
ELECT REM PT RETURN 9FT ADLT (ELECTROSURGICAL) ×3
ELECTRODE REM PT RTRN 9FT ADLT (ELECTROSURGICAL) ×1 IMPLANT
GLOVE BIO SURGEON STRL SZ 6.5 (GLOVE) ×2 IMPLANT
GLOVE BIO SURGEONS STRL SZ 6.5 (GLOVE) ×1
GLOVE BIOGEL PI IND STRL 7.0 (GLOVE) ×1 IMPLANT
GLOVE BIOGEL PI INDICATOR 7.0 (GLOVE) ×2
GOWN STRL REUS W/ TWL LRG LVL3 (GOWN DISPOSABLE) ×3 IMPLANT
GOWN STRL REUS W/TWL LRG LVL3 (GOWN DISPOSABLE) ×6
KIT TURNOVER KIT A (KITS) ×3 IMPLANT
LABEL OR SOLS (LABEL) ×3 IMPLANT
MESH VENTRALEX ST 1-7/10 CRC S (Mesh General) ×2 IMPLANT
NEEDLE HYPO 22GX1.5 SAFETY (NEEDLE) ×3 IMPLANT
NS IRRIG 500ML POUR BTL (IV SOLUTION) ×3 IMPLANT
PACK BASIN MINOR ARMC (MISCELLANEOUS) ×3 IMPLANT
SUT ETHIBOND NAB MO 7 #0 18IN (SUTURE) ×3 IMPLANT
SUT MNCRL 4-0 (SUTURE) ×2
SUT MNCRL 4-0 27XMFL (SUTURE) ×1
SUT VIC AB 2-0 SH 27 (SUTURE) ×2
SUT VIC AB 2-0 SH 27XBRD (SUTURE) ×1 IMPLANT
SUT VIC AB 3-0 SH 27 (SUTURE) ×2
SUT VIC AB 3-0 SH 27X BRD (SUTURE) ×1 IMPLANT
SUT VICRYL 0 AB UR-6 (SUTURE) ×4 IMPLANT
SUTURE MNCRL 4-0 27XMF (SUTURE) ×1 IMPLANT
SYR 10ML LL (SYRINGE) ×6 IMPLANT
TOWEL OR 17X26 4PK STRL BLUE (TOWEL DISPOSABLE) ×3 IMPLANT
WATER STERILE IRR 1000ML POUR (IV SOLUTION) ×3 IMPLANT

## 2018-01-05 NOTE — Op Note (Signed)
Preoperative diagnosis: umbilical hernia, incarcerated Postoperative diagnosis: ventral hernia and umbilical hernia  Procedure:  Open ventral hernia repair with mesh, primary closure of umbilical hernia  Anesthesia: LMA  Surgeon: Sung AmabileIsami Sana Tessmer  Wound Classification: Clean  Specimen: none Complications: None  Estimated Blood Loss: minimal  Indications:see HPI  Findings: 1. A 2cm x 1cm ventral hernia, incarcerated, not strangulated 2. A 3mm x 2mm umbilical hernia  4. Tension free repair achieved with mesh and suture 5. Adequate hemostasis  Description of procedure: The patient was brought to the operating room and general anesthesia was induced. A time-out was completed verifying correct patient, procedure, site, positioning, and implant(s) and/or special equipment prior to beginning this procedure. Antibiotics were administered prior to making the incision. The anterior abdominal wall was prepped and draped in the standard sterile fashion.   An infraumbilical incision was made after infusing the preplanned incision with half percent Marcaine.  Dissection carried down to fascia where the umbilical stalk was noted.  The stalk was transected and there was noted to be a very small 2 mm x 3 mm umbilical hernia with easily reducible contents.  Further exploration of the area noted a much larger hernia that was initially palpable on exam 3cm cephalad to umbilical stalk attachment.  The preperitoneal fat contents were dissected off the surrounding structures and reduced.  Hemostasis was confirmed prior to reducing the actual contents.  Once the contents were reduced there is noted to be a 2 cm x 1 cm hernia defect.  Due to some degree of diastases as well as the patient's current weight, decision was made to proceed with a mesh repair.    A 4.2 cm umbilical mesh was placed within the preperitoneal cavity through the present defect and secured in place on the abdominal wall using interrupted 0  Ethibond sutures x 8.  Once the mesh was noted to be laying flat and secured to the abdominal wall the defect itself was primary closed using 0 Ethibond x2 in a figure-of-eight fashion.  The smaller umbilical hernia defect was closed with interrupted 0 Vicryl suture.  The fascia site as well as the skin incision was then infused with 20 mL's of Exparel expanded with 25 mL's of half percent Marcaine.  After confirming hemostasis, the umbilical stalk was reattached to the abdominal wall using 2-0 Vicryl and the wound was irrigated and closed in a multilayer fashion, using 3-0 Vicryl for the deep dermal layer in an interrupted fashion and running 4-0 Monocryl in a subcuticular fashion.  Wound was then dressed with Dermabond.  Patient was then successfully awakened and transferred to PACU in stable condition.  At the end of the procedure sponge and instrument counts were correct

## 2018-01-05 NOTE — Discharge Instructions (Signed)

## 2018-01-05 NOTE — Anesthesia Preprocedure Evaluation (Signed)
Anesthesia Evaluation  Patient identified by MRN, date of birth, ID band Patient awake    Reviewed: Allergy & Precautions, NPO status , Patient's Chart, lab work & pertinent test results  History of Anesthesia Complications Negative for: history of anesthetic complications  Airway Mallampati: III  TM Distance: >3 FB Neck ROM: Full    Dental no notable dental hx.    Pulmonary asthma , sleep apnea and Continuous Positive Airway Pressure Ventilation , COPD,    breath sounds clear to auscultation- rhonchi (-) wheezing      Cardiovascular Exercise Tolerance: Good (-) hypertension(-) CAD, (-) Past MI, (-) Cardiac Stents and (-) CABG  Rhythm:Regular Rate:Normal - Systolic murmurs and - Diastolic murmurs    Neuro/Psych negative neurological ROS  negative psych ROS   GI/Hepatic Neg liver ROS, GERD  ,  Endo/Other  negative endocrine ROSneg diabetes  Renal/GU negative Renal ROS     Musculoskeletal negative musculoskeletal ROS (+)   Abdominal (+) + obese,   Peds  Hematology  (+) anemia ,   Anesthesia Other Findings Past Medical History: No date: Anemia No date: Asthma 2014: Bacterial pneumonia No date: Beta thalassemia (HCC) No date: Emphysema lung (HCC) No date: GERD (gastroesophageal reflux disease) No date: Sleep apnea     Comment:  uses cpap   Reproductive/Obstetrics                             Anesthesia Physical Anesthesia Plan  ASA: II  Anesthesia Plan: General   Post-op Pain Management:    Induction: Intravenous  PONV Risk Score and Plan: 1 and Ondansetron and Midazolam  Airway Management Planned: LMA  Additional Equipment:   Intra-op Plan:   Post-operative Plan:   Informed Consent: I have reviewed the patients History and Physical, chart, labs and discussed the procedure including the risks, benefits and alternatives for the proposed anesthesia with the patient or  authorized representative who has indicated his/her understanding and acceptance.   Dental advisory given  Plan Discussed with: CRNA and Anesthesiologist  Anesthesia Plan Comments:         Anesthesia Quick Evaluation

## 2018-01-05 NOTE — Anesthesia Post-op Follow-up Note (Signed)
Anesthesia QCDR form completed.        

## 2018-01-05 NOTE — Brief Op Note (Signed)
01/05/2018  9:41 AM  PATIENT:  Evan Edwards  47 y.o. male  PRE-OPERATIVE DIAGNOSIS:  umbilical hernia  POST-OPERATIVE DIAGNOSIS:  umbilical hernia  PROCEDURE:  Procedure(s): HERNIA REPAIR UMBILICAL ADULT WITH MESH (N/A)  SURGEON:  Surgeon(s) and Role:    * Azhia Siefken, DO - Primary  PHYSICIAN ASSISTANT:   ASSISTANTS: none   ANESTHESIA:   general  EBL:  5 mL   BLOOD ADMINISTERED:none  DRAINS: none   LOCAL MEDICATIONS USED:  MARCAINE    and OTHER exparel  SPECIMEN:  No Specimen  DISPOSITION OF SPECIMEN:  N/A  COUNTS:  YES  TOURNIQUET:  * No tourniquets in log *  DICTATION: .Note written in EPIC  PLAN OF CARE: Discharge to home after PACU  PATIENT DISPOSITION:  PACU - hemodynamically stable.   Delay start of Pharmacological VTE agent (>24hrs) due to surgical blood loss or risk of bleeding: not applicable   Samra Pesch [facility]   RxSearch > Patient Request    Support: [phone]   Darnelle Boshadrick Gaige, 80M  Powered by  Narx Report Resources Date: 01/05/2018  Download CSV Download PDF  Darnelle Boshadrick Thunder  Linked Records Name DOB ID Gender Address Darnelle BosChadrick Dykema Jul 25, 1970 [address] Male [address] [place], [place] [id number] Report Criteria First Name Last Name DOB Darnelle BosChadrick Gent Jul 25, 1970 Risk Indicators  NARX SCORES Narcotic 110 Sedative 060 Stimulant 000 Explanation and Guidance OVERDOSE RISK SCORE   330  (Range 000-999) Explanation and Guidance ADDITIONAL RISK INDICATORS ( 0 )  Explanation and Guidance This NarxCare report is based on search criteria supplied and the data entered by the dispensing pharmacy. For more information about any prescription, please contact the dispensing pharmacy or the prescriber. NarxCare scores and reports are intended to aid, not replace, medical decision making. None of the information presented should be used as sole justification for providing or refusing to provide medications. The information on  this report is not warranted as accurate or complete.  Graphs  RX GRAPH Narcotic Sedative Stimulant Other     All Prescribers      Timeline 01/05/2018 72940m 3165m 1y 2y  Prescribers 1 - Lenny PastelQuinn S Woodruff  2 - Marveen ReeksRobert J Tumey  Morphine MgEq (MME)    320 200 80 0    Timeline 08/08 39940m 565m 1y 2y  *Per CDC guidance, the MME conversion factors prescribed or provided as part of the medication-assisted treatment for opioid use disorder should not be used to benchmark against dosage thresholds meant for opioids prescribed for pain. Buprenorphine products have no agreed upon morphine equivalency, and as partial opioid agonists, are not expected to be associated with overdose risk in the same dose-dependent manner as doses for full agonist opioids. MME = morphine milligram equivalents. LME = Lorazepam milligram equivalents. mg = dose in milligrams.   Summary  Summary  Total Prescriptions: 3 Total Prescribers: 2 Total Pharmacies: 3  Narcotics* (excluding buprenorphine)  Current Qty: 0 Current MME/day: 0.00 30 Day Avg MME/day: 0.00  Sedatives*  Current Qty: 0 Current LME/day: 0.00 30 Day Avg LME/day: 0.00  Buprenorphine*  Current Qty: 0 Current mg/day: 0.00 30 Day Avg mg/day: 0.00  Rx Data  PRESCRIPTIONS Total Prescriptions: 3 Total Private Pay: 0 Fill Date ID Written Drug Qty Days Prescriber Rx # Pharmacy Refill Daily Dose * Pymt Type PMP 08/17/2017  1  08/17/2017  Oxycodone Hcl 5 Mg Tablet  6.00 2 Katheran JamesQu Woo  [id number]  [place] ([id number])  1/1 22.50 MME Comm Ins  [place] 02/22/2017  1  02/22/2017  Hydrocodone-Homatropine Syrup  120.00 6 [place]  [id number]  [place] ([id number])  1/1 20.00 MME Comm Ins  [place] 06/18/2016  1  06/18/2016  Hydrocodone-Homatropine Syrup  120.00 6 [place]  [id number]  [place] ([id number])  1/1 20.00 MME Comm Ins  [employer] *Per CDC guidance, the MME conversion factors prescribed or provided as part of the medication-assisted  treatment for opioid use disorder should not be used to benchmark against dosage thresholds meant for opioids prescribed for pain. Buprenorphine products have no agreed upon morphine equivalency, and as partial opioid agonists, are not expected to be associated with overdose risk in the same dose-dependent manner as doses for full agonist opioids. MME = morphine milligram equivalents. LME = Lorazepam milligram equivalents. mg = dose in milligrams.  Providers Total Providers: 2 Name Address [facility] Phone Alisia Ferrari [address] [place] [address] - Lenny Pastel [address] [place] [address] - Pharmacies Total Pharmacies: 3 Name Address [facility] Zipcode Phone [facility] [place]. [phone]) [address] [place] [address] - [facility] [phone]) [address] [place] [address] - [facility] [phone]) [address] [place] [address] -  Physician (MD, DO): The information in this system may contain errors resulting from how the information was entered into the data file. Controlled Substance Reporting System staff suggest that additional independent verification with pharmacies and practitioners may sometime be prudent or necessary.   Powered By     PMP AWA?E [address] [place], [place] [address] [phone]   [employer] 2019. All Rights Reserved.

## 2018-01-05 NOTE — Interval H&P Note (Signed)
History and Physical Interval Note:  01/05/2018 7:14 AM  Evan Edwards  has presented today for surgery, with the diagnosis of umbilical hernia  The various methods of treatment have been discussed with the patient and family. After consideration of risks, benefits and other options for treatment, the patient has consented to  Procedure(s): HERNIA REPAIR UMBILICAL ADULT (N/A) as a surgical intervention .  The patient's history has been reviewed, patient examined, no change in status, stable for surgery.  I have reviewed the patient's chart and labs.  Questions were answered to the patient's satisfaction.     [name] Tonna BoehringerSakai

## 2018-01-05 NOTE — Transfer of Care (Signed)
Immediate Anesthesia Transfer of Care Note  Patient: Evan Edwards  Procedure(s) Performed: HERNIA REPAIR UMBILICAL ADULT WITH MESH (N/A )  Patient Location: PACU  Anesthesia Type:General  Level of Consciousness: sedated  Airway & Oxygen Therapy: Patient Spontanous Breathing and Patient connected to face mask oxygen  Post-op Assessment: Report given to RN and Post -op Vital signs reviewed and stable  Post vital signs: Reviewed and stable  Last Vitals:  Vitals Value Taken Time  BP 144/92 01/05/2018  8:57 AM  Temp    Pulse 102 01/05/2018  8:57 AM  Resp 17 01/05/2018  8:57 AM  SpO2 95 % 01/05/2018  8:57 AM  Vitals shown include unvalidated device data.  Last Pain:  Vitals:   01/05/18 0610  TempSrc: Oral  PainSc: 0-No pain         Complications: No apparent anesthesia complications

## 2018-01-05 NOTE — Anesthesia Procedure Notes (Signed)
Procedure Name: LMA Insertion Performed by: Jacquelynn Friend, CRNA Pre-anesthesia Checklist: Patient identified, Patient being monitored, Timeout performed, Emergency Drugs available and Suction available Patient Re-evaluated:Patient Re-evaluated prior to induction Oxygen Delivery Method: Circle system utilized Preoxygenation: Pre-oxygenation with 100% oxygen Induction Type: IV induction Ventilation: Mask ventilation without difficulty LMA: LMA inserted LMA Size: 4.5 Tube type: Oral Number of attempts: 1 Placement Confirmation: positive ETCO2 and breath sounds checked- equal and bilateral Tube secured with: Tape Dental Injury: Teeth and Oropharynx as per pre-operative assessment        

## 2018-01-05 NOTE — Anesthesia Postprocedure Evaluation (Signed)
Anesthesia Post Note  Patient: Evan Edwards  Procedure(s) Performed: HERNIA REPAIR UMBILICAL ADULT WITH MESH (N/A )  Patient location during evaluation: PACU Anesthesia Type: General Level of consciousness: awake and alert and oriented Pain management: pain level controlled Vital Signs Assessment: post-procedure vital signs reviewed and stable Respiratory status: spontaneous breathing, nonlabored ventilation and respiratory function stable Cardiovascular status: blood pressure returned to baseline and stable Postop Assessment: no signs of nausea or vomiting Anesthetic complications: no     Last Vitals:  Vitals:   01/05/18 0957 01/05/18 1012  BP: (!) 165/95 (!) 156/91  Pulse: 100 95  Resp: 18 18  Temp: 36.6 C   SpO2: 94% 94%    Last Pain:  Vitals:   01/05/18 1012  TempSrc:   PainSc: 0-No pain                 Vega Withrow

## 2018-02-21 ENCOUNTER — Emergency Department: Payer: 59

## 2018-02-21 ENCOUNTER — Encounter: Payer: Self-pay | Admitting: Emergency Medicine

## 2018-02-21 ENCOUNTER — Inpatient Hospital Stay (HOSPITAL_COMMUNITY)
Admit: 2018-02-21 | Discharge: 2018-02-21 | Disposition: A | Discharge status: Home / Self Care | Payer: 59 | Attending: Internal Medicine | Admitting: Internal Medicine

## 2018-02-21 ENCOUNTER — Other Ambulatory Visit: Payer: Self-pay

## 2018-02-21 ENCOUNTER — Inpatient Hospital Stay
Admission: EM | Admit: 2018-02-21 | Discharge: 2018-02-23 | DRG: 195 | Disposition: A | Discharge status: Home / Self Care | Payer: 59 | Attending: Internal Medicine | Admitting: Internal Medicine

## 2018-02-21 DIAGNOSIS — J189 Pneumonia, unspecified organism: Secondary | ICD-10-CM | POA: Diagnosis not present

## 2018-02-21 DIAGNOSIS — R635 Abnormal weight gain: Secondary | ICD-10-CM | POA: Diagnosis present

## 2018-02-21 DIAGNOSIS — J45909 Unspecified asthma, uncomplicated: Secondary | ICD-10-CM | POA: Diagnosis present

## 2018-02-21 DIAGNOSIS — R0602 Shortness of breath: Secondary | ICD-10-CM | POA: Diagnosis not present

## 2018-02-21 DIAGNOSIS — D561 Beta thalassemia: Secondary | ICD-10-CM | POA: Diagnosis present

## 2018-02-21 DIAGNOSIS — Z79899 Other long term (current) drug therapy: Secondary | ICD-10-CM

## 2018-02-21 DIAGNOSIS — Z8249 Family history of ischemic heart disease and other diseases of the circulatory system: Secondary | ICD-10-CM | POA: Diagnosis not present

## 2018-02-21 DIAGNOSIS — R609 Edema, unspecified: Secondary | ICD-10-CM

## 2018-02-21 DIAGNOSIS — Z8701 Personal history of pneumonia (recurrent): Secondary | ICD-10-CM

## 2018-02-21 DIAGNOSIS — D649 Anemia, unspecified: Secondary | ICD-10-CM | POA: Diagnosis present

## 2018-02-21 DIAGNOSIS — Z7951 Long term (current) use of inhaled steroids: Secondary | ICD-10-CM | POA: Diagnosis not present

## 2018-02-21 DIAGNOSIS — I5030 Unspecified diastolic (congestive) heart failure: Secondary | ICD-10-CM

## 2018-02-21 DIAGNOSIS — Z9049 Acquired absence of other specified parts of digestive tract: Secondary | ICD-10-CM

## 2018-02-21 DIAGNOSIS — K219 Gastro-esophageal reflux disease without esophagitis: Secondary | ICD-10-CM | POA: Diagnosis present

## 2018-02-21 DIAGNOSIS — R079 Chest pain, unspecified: Secondary | ICD-10-CM | POA: Diagnosis not present

## 2018-02-21 DIAGNOSIS — I776 Arteritis, unspecified: Secondary | ICD-10-CM

## 2018-02-21 DIAGNOSIS — Z6839 Body mass index (BMI) 39.0-39.9, adult: Secondary | ICD-10-CM | POA: Diagnosis not present

## 2018-02-21 DIAGNOSIS — Z87891 Personal history of nicotine dependence: Secondary | ICD-10-CM | POA: Diagnosis not present

## 2018-02-21 DIAGNOSIS — Z9989 Dependence on other enabling machines and devices: Secondary | ICD-10-CM | POA: Diagnosis not present

## 2018-02-21 DIAGNOSIS — F1010 Alcohol abuse, uncomplicated: Secondary | ICD-10-CM | POA: Diagnosis present

## 2018-02-21 DIAGNOSIS — J439 Emphysema, unspecified: Secondary | ICD-10-CM | POA: Diagnosis present

## 2018-02-21 DIAGNOSIS — G4733 Obstructive sleep apnea (adult) (pediatric): Secondary | ICD-10-CM | POA: Diagnosis present

## 2018-02-21 DIAGNOSIS — M549 Dorsalgia, unspecified: Secondary | ICD-10-CM | POA: Diagnosis present

## 2018-02-21 DIAGNOSIS — I2510 Atherosclerotic heart disease of native coronary artery without angina pectoris: Secondary | ICD-10-CM | POA: Diagnosis present

## 2018-02-21 LAB — COMPREHENSIVE METABOLIC PANEL
ALBUMIN: 3.8 g/dL (ref 3.5–5.0)
ALT: 34 U/L (ref 0–44)
AST: 30 U/L (ref 15–41)
Alkaline Phosphatase: 86 U/L (ref 38–126)
Anion gap: 9 (ref 5–15)
BUN: 10 mg/dL (ref 6–20)
CO2: 22 mmol/L (ref 22–32)
Calcium: 8.6 mg/dL — ABNORMAL LOW (ref 8.9–10.3)
Chloride: 104 mmol/L (ref 98–111)
Creatinine, Ser: 0.65 mg/dL (ref 0.61–1.24)
GFR calc Af Amer: >60 mL/min (ref >60)
Glucose, Bld: 132 mg/dL — ABNORMAL HIGH (ref 70–99)
POTASSIUM: 4 mmol/L (ref 3.5–5.1)
Sodium: 135 mmol/L (ref 135–145)
Total Bilirubin: 0.5 mg/dL (ref 0.3–1.2)
Total Protein: 7.3 g/dL (ref 6.5–8.1)

## 2018-02-21 LAB — CBC WITH DIFFERENTIAL/PLATELET
BASOS ABS: 0.2 10*3/uL — AB (ref 0–0.1)
BASOS PCT: 1 %
EOS PCT: 0 %
Eosinophils Absolute: 0.1 10*3/uL (ref 0–0.7)
HCT: 33.5 % — ABNORMAL LOW (ref 40.0–52.0)
Hemoglobin: 10.1 g/dL — ABNORMAL LOW (ref 13.0–18.0)
Lymphocytes Relative: 5 %
Lymphs Abs: 0.9 10*3/uL — ABNORMAL LOW (ref 1.0–3.6)
MCH: 19.3 pg — ABNORMAL LOW (ref 26.0–34.0)
MCHC: 30.3 g/dL — ABNORMAL LOW (ref 32.0–36.0)
MCV: 63.6 fL — ABNORMAL LOW (ref 80.0–100.0)
MONO ABS: 0.9 10*3/uL (ref 0.2–1.0)
Monocytes Relative: 5 %
Neutro Abs: 15.4 10*3/uL — ABNORMAL HIGH (ref 1.4–6.5)
Neutrophils Relative %: 89 %
Platelets: 164 10*3/uL (ref 150–440)
RBC: 5.27 MIL/uL (ref 4.40–5.90)
RDW: 20.4 % — AB (ref 11.5–14.5)
WBC: 17.5 10*3/uL — ABNORMAL HIGH (ref 3.8–10.6)

## 2018-02-21 LAB — C-REACTIVE PROTEIN: CRP: 6.4 mg/dL — AB (ref <1.0)

## 2018-02-21 LAB — SEDIMENTATION RATE: SED RATE: 9 mm/h (ref 0–15)

## 2018-02-21 LAB — PROTIME-INR
INR: 1.01
Prothrombin Time: 13.2 seconds (ref 11.4–15.2)

## 2018-02-21 LAB — TROPONIN I: Troponin I: <0.03 ng/mL (ref <0.03)

## 2018-02-21 LAB — BRAIN NATRIURETIC PEPTIDE: B NATRIURETIC PEPTIDE 5: 44 pg/mL (ref 0.0–100.0)

## 2018-02-21 MED ORDER — TERBINAFINE HCL 250 MG PO TABS
250.0000 mg | ORAL_TABLET | Freq: Every day | ORAL | Status: DC
  Administered 2018-02-22 – 2018-02-23 (×2): 250 mg via ORAL
  Filled 2018-02-21 (×2): qty 1

## 2018-02-21 MED ORDER — LORATADINE 10 MG PO TABS
10.0000 mg | ORAL_TABLET | Freq: Every evening | ORAL | Status: DC
  Administered 2018-02-21 – 2018-02-22 (×2): 10 mg via ORAL
  Filled 2018-02-21 (×2): qty 1

## 2018-02-21 MED ORDER — PANTOPRAZOLE SODIUM 40 MG PO TBEC
40.0000 mg | DELAYED_RELEASE_TABLET | Freq: Every day | ORAL | Status: DC
  Administered 2018-02-21 – 2018-02-23 (×3): 40 mg via ORAL
  Filled 2018-02-21 (×3): qty 1

## 2018-02-21 MED ORDER — MOMETASONE FURO-FORMOTEROL FUM 200-5 MCG/ACT IN AERO
2.0000 | INHALATION_SPRAY | Freq: Two times a day (BID) | RESPIRATORY_TRACT | Status: DC
  Administered 2018-02-21 – 2018-02-23 (×4): 2 via RESPIRATORY_TRACT
  Filled 2018-02-21: qty 8.8

## 2018-02-21 MED ORDER — IOHEXOL 300 MG/ML  SOLN
75.0000 mL | Freq: Once | INTRAMUSCULAR | Status: AC | PRN
  Administered 2018-02-21: 75 mL via INTRAVENOUS

## 2018-02-21 MED ORDER — ENOXAPARIN SODIUM 40 MG/0.4ML ~~LOC~~ SOLN
40.0000 mg | SUBCUTANEOUS | Status: DC
  Filled 2018-02-21: qty 0.4

## 2018-02-21 MED ORDER — SODIUM CHLORIDE 0.9 % IV SOLN
1.0000 g | INTRAVENOUS | Status: DC
  Administered 2018-02-21 – 2018-02-22 (×2): 1 g via INTRAVENOUS
  Filled 2018-02-21: qty 1
  Filled 2018-02-21 (×2): qty 10

## 2018-02-21 MED ORDER — NAPROXEN 250 MG PO TABS
500.0000 mg | ORAL_TABLET | Freq: Two times a day (BID) | ORAL | Status: DC | PRN
  Filled 2018-02-21: qty 2

## 2018-02-21 MED ORDER — SODIUM CHLORIDE 0.9 % IV SOLN
500.0000 mg | INTRAVENOUS | Status: DC
  Administered 2018-02-21 – 2018-02-22 (×2): 500 mg via INTRAVENOUS
  Filled 2018-02-21 (×3): qty 500

## 2018-02-21 MED ORDER — DIPHENHYDRAMINE HCL 25 MG PO CAPS
25.0000 mg | ORAL_CAPSULE | Freq: Four times a day (QID) | ORAL | Status: DC | PRN
  Administered 2018-02-21: 25 mg via ORAL
  Filled 2018-02-21: qty 1

## 2018-02-21 MED ORDER — ALBUTEROL SULFATE (2.5 MG/3ML) 0.083% IN NEBU: 2.5000 mg | INHALATION_SOLUTION | Freq: Four times a day (QID) | RESPIRATORY_TRACT | Status: DC | PRN

## 2018-02-21 NOTE — ED Provider Notes (Signed)
[facility] Emergency Department Provider Note ____________________________________________   First MD Initiated Contact with Patient 02/21/18 1513     (approximate)  I have reviewed the triage vital signs and the nursing notes.   HISTORY  Chief Complaint Shortness of Breath and Leg Swelling  HPI Evan Edwards is a 47 y.o. male with a history of beta thalassemia, sleep apnea as well as recent cellulitis status post hernia repair who was presented to the emergency department with several weeks of worsening shortness of breath as well as weight gain.  He says that he has gained 30 pounds over the past 6 weeks.  Says that he is also swollen with a thickened neck as well as bilateral lower extremity swelling.  Also says that he has developed a rash to his bilateral lower extreme knees which is itchy over the past week.  Says that he has been on sulfa antibiotics but stopped his course 2 weeks ago.  However, he says that he took 1 tab within the last 24 hours secondary to his leg symptoms.  Also on a prednisone taper as of September 12 secondary to back pain.  Past Medical History:  Diagnosis Date  . Anemia   . Asthma   . Bacterial pneumonia 2014  . Beta thalassemia (HCC)   . Emphysema lung (HCC)   . GERD (gastroesophageal reflux disease)   . Sleep apnea    uses cpap    There are no active problems to display for this patient.   Past Surgical History:  Procedure Laterality Date  . APPENDECTOMY    . Bacterial Pneumonia Surgery     Pt unable to state what exactly was done, states "they went inside of removed it".  . UMBILICAL HERNIA REPAIR N/A 01/05/2018   Procedure: HERNIA REPAIR UMBILICAL ADULT WITH MESH;  Surgeon: Sung AmabileSakai, Isami, DO;  Location: [facility] ORS;  Service: General;  Laterality: N/A;  . UPPER GI ENDOSCOPY      Prior to Admission medications   Medication Sig Start Date End Date Taking? Authorizing Provider  cetirizine (ZYRTEC) 10 MG tablet  Take 10 mg by mouth every morning.  12/12/17  Yes [provider]  loratadine (CLARITIN) 10 MG tablet Take 10 mg by mouth every evening.   Yes [provider]  predniSONE (DELTASONE) 10 MG tablet Take 10-40 mg by mouth daily. 40mg  x 3 days, 30mg  x 3 days, 20mg  x 3 days, 10mg  x 3 days 02/09/18 02/21/18 Yes [provider]  terbinafine (LAMISIL) 250 MG tablet Take 250 mg by mouth daily. 12/07/17  Yes [provider]  HYDROcodone-acetaminophen (NORCO) 5-325 MG tablet Take 1-2 tablets by mouth every 6 (six) hours as needed for up to 10 doses for moderate pain. Patient not taking: Reported on 02/21/2018 01/05/18   Sung AmabileSakai, Isami, DO  ibuprofen (ADVIL,MOTRIN) 200 MG tablet Take 4 tablets (800 mg total) by mouth 3 (three) times daily. Patient not taking: Reported on 02/21/2018 01/05/18   Sung AmabileSakai, Isami, DO  NAPROXEN DR 500 MG EC tablet Take 1 tablet by mouth 2 (two) times daily as needed. 02/08/18   [provider]  PROAIR HFA 108 (90 Base) MCG/ACT inhaler Inhale 2 puffs into the lungs every 6 (six) hours as needed. For wheezing. 04/25/15   [provider]  SYMBICORT 160-4.5 MCG/ACT inhaler Inhale 2 puffs into the lungs 2 (two) times daily. 07/05/15   [provider]    Allergies Patient has no known allergies.  No family history on  file.  Social History Social History   Tobacco Use  . Smoking status: Never Smoker  . Smokeless tobacco: Former Engineer, water Use Topics  . Alcohol use: Yes    Alcohol/week: 4.0 standard drinks    Types: 4 Cans of beer per week  . Drug use: No    Review of Systems  Constitutional: No fever/chills Eyes: No visual changes. ENT: No sore throat. Cardiovascular: Denies chest pain. Respiratory: As above Gastrointestinal: No abdominal pain.  No nausea, no vomiting.  No diarrhea.  No constipation. Genitourinary: Negative for dysuria. Musculoskeletal: Negative for back pain. Skin: Negative for rash. Neurological:  Negative for headaches, focal weakness or numbness.   ____________________________________________   PHYSICAL EXAM:  VITAL SIGNS: ED Triage Vitals  Enc Vitals Group     BP 02/21/18 1455 (!) 153/86     Pulse Rate 02/21/18 1455 95     Resp 02/21/18 1455 18     Temp 02/21/18 1455 98.2 F (36.8 C)     Temp Source 02/21/18 1455 Oral     SpO2 02/21/18 1455 95 %     Weight 02/21/18 1455 260 lb 5.8 oz (118.1 kg)     Height 02/21/18 1455 5\' 8"  (1.727 m)     Head Circumference --      Peak Flow --      Pain Score 02/21/18 1508 0     Pain Loc --      Pain Edu? --      Excl. in GC? --     Constitutional: Alert and oriented. Well appearing and in no acute distress. Eyes: Conjunctivae are normal.  Head: Atraumatic. Nose: No congestion/rhinnorhea. Mouth/Throat: Mucous membranes are moist.  Neck: No stridor.   Cardiovascular: Normal rate, regular rhythm. Grossly normal heart sounds.   Respiratory: Patient with minimally labored respirations with minimally decreased lung sounds throughout all fields but without any wheezing or rales or rhonchi. Gastrointestinal: Soft and nontender. No distention.  No induration or erythema at the umbilicus at the previous site of the patient's cellulitis. Musculoskeletal: Minimal to moderate bilateral lower extremity edema from ankles just distal to the knees.   Neurologic:  Normal speech and language. No gross focal neurologic deficits are appreciated. Skin: Patient with lesions to the bilateral lower extreme knees.  Approximately 1 to 2 cm in circular and erythematous to purpuric and minimally palpable.  Nonblanching.  No induration.  No exudates.  No lesions on the palms and soles.  No intraoral lesions. Psychiatric: Mood and affect are normal. Speech and behavior are normal.  ____________________________________________   LABS (all labs ordered are listed, but only abnormal results are displayed)  Labs Reviewed  CBC WITH DIFFERENTIAL/PLATELET -  Abnormal; Notable for the following components:      Result Value   WBC 17.5 (*)    Hemoglobin 10.1 (*)    HCT 33.5 (*)    MCV 63.6 (*)    MCH 19.3 (*)    MCHC 30.3 (*)    RDW 20.4 (*)    Neutro Abs 15.4 (*)    Lymphs Abs 0.9 (*)    Basophils Absolute 0.2 (*)    All other components within normal limits  COMPREHENSIVE METABOLIC PANEL - Abnormal; Notable for the following components:   Glucose, Bld 132 (*)    Calcium 8.6 (*)    All other components within normal limits  TROPONIN I  PROTIME-INR  SEDIMENTATION RATE  BRAIN NATRIURETIC PEPTIDE  URINALYSIS, COMPLETE (UACMP) WITH MICROSCOPIC   ____________________________________________  EKG  ED ECG REPORT I, Arelia Longest, the attending physician, personally viewed and interpreted this ECG.   Date: 02/21/2018  EKG Time: 1518  Rate: 97  Rhythm: normal sinus rhythm  Axis: Left axis  Intervals:none  ST&T Change: No ST segment elevation or depression.  Single T wave inversion in aVL. No significant change from previous. ____________________________________________  RADIOLOGY  Increased lung markings consistent with pneumonitis. ____________________________________________   PROCEDURES  Procedure(s) performed:   Procedures  Critical Care performed:   ____________________________________________   INITIAL IMPRESSION / ASSESSMENT AND PLAN / ED COURSE  Pertinent labs & imaging results that were available during my care of the patient were reviewed by me and considered in my medical decision making (see chart for details).  Differential includes, but is not limited to, viral syndrome, bronchitis including COPD exacerbation, pneumonia, reactive airway disease including asthma, CHF including exacerbation with or without pulmonary/interstitial edema, pneumothorax, ACS, thoracic trauma, and pulmonary embolism. As part of my medical decision making, I reviewed the following data within the electronic MEDICAL RECORD NUMBER  Notes from prior ED visits  ----------------------------------------- 4:52 PM on 02/21/2018 -----------------------------------------  I discussed the case with Dr. Gavin Potters of rheumatology who recommends CT with contrast of the chest.  He says to hold on any steroids or further treatment at this time as a further work-up will be needed.  I reviewed the labs with patient including the elevated white blood cell count in the normal sed rate.  Patient understanding the need for admission to the hospital as well as a suspected diagnosis and willing to comply.  Discussed the case Dr. Fonnie Birkenhead of the hospitalist service who accepts the patient onto his service.  ____________________________________________   FINAL CLINICAL IMPRESSION(S) / ED DIAGNOSES  Shortness of breath.  Vasculitis.  Pneumonitis.  NEW MEDICATIONS STARTED DURING THIS VISIT:  New Prescriptions   No medications on file     Note:  This document was prepared using Dragon voice recognition software and may include unintentional dictation errors.     Myrna Blazer, MD 02/21/18 [phone]

## 2018-02-21 NOTE — H&P (Signed)
[facility] [place] at [facility]   PATIENT NAME: Evan Edwards Mantz    MR#:  [id number]  DATE OF BIRTH:  04/06/1971  DATE OF ADMISSION:  02/21/2018  PR[facility] C[facility] PHYSIC[facility]   REQUESTING/REFERRING PHYSICIAN: Myrna BlazerSchaevitz, David Matthew, MD  CHIEF COMPLAINT:  Sob   HISTORY OF PRESENT ILLNESS:  Evan Edwards Setterlund  is a 47 y.o. male with a known history of chronic asthma, beta thalassemia, GERD, obstructive sleep apnea on CPAP machine nightly is presenting to the ED with a chief complaint of night sweats, shortness of breath and coughing.  Patient also reports he gained 30 pounds over the past 6 weeks.  He feels like neck is swollen and developed a rash on his lower extremities below the knees which is itchy in nature.  Patient was placed on sulfa antibiotics which was stopped 2 weeks ago.  He is on steroid tapering dose as of September 12 for his back pain.  pt was on Bactrim for cellulitis status post hernia repair.  ED physician has discussed with rheumatology [facility]. [facility] who is not recommending any steroids at this time but further work-up to rule out vasculitis  PAST MEDICAL HISTORY:   Past Medical History:  Diagnosis Date  . Anemia   . Asthma   . Bacterial pneumonia 2014  . Beta thalassemia (HCC)   . Emphysema lung (HCC)   . GERD (gastroesophageal reflux disease)   . Sleep apnea    uses cpap    PAST SURGICAL HISTOIRY:   Past Surgical History:  Procedure Laterality Date  . APPENDECTOMY    . Bacterial Pneumonia Surgery     Pt unable to state what exactly was done, states "they went inside of removed it".  . UMBILICAL HERNIA REPAIR N/A 01/05/2018   Procedure: HERNIA REPAIR UMBILICAL ADULT WITH MESH;  Surgeon: Sung AmabileSakai, Isami, DO;  Location: [facility] ORS;  Service: General;  Laterality: N/A;  . UPPER GI ENDOSCOPY      SOCIAL HISTORY:   Social History   Tobacco Use  . Smoking status: Never Smoker  . Smokeless tobacco: Former Estate agentUser   Substance Use Topics  . Alcohol use: Yes    Alcohol/week: 4.0 standard drinks    Types: 4 Cans of beer per week    FAMILY HISTORY:  No family history on file.  DRUG ALLERGIES:  No Known Allergies  REVIEW OF SYSTEMS:  CONSTITUTIONAL: No fever, fatigue or weakness.  Reporting significant weight gain EYES: No blurred or double vision.  EARS, NOSE, AND THROAT: No tinnitus or ear pain.  RESPIRATORY: reports cough, shortness of breath, no  wheezing or hemoptysis.  CARDIOVASCULAR: No chest pain, orthopnea, edema.  GASTROINTESTINAL: No nausea, vomiting, diarrhea or abdominal pain.  GENITOURINARY: No dysuria, hematuria.  ENDOCRINE: No polyuria, nocturia,  HEMATOLOGY: No anemia, easy bruising or bleeding SKIN: No rash or lesion. MUSCULOSKELETAL: No joint pain or arthritis.   NEUROLOGIC: No tingling, numbness, weakness.  PSYCHIATRY: No anxiety or depression.   MEDICATIONS AT HOME:   Prior to Admission medications   Medication Sig Start Date End Date Taking? Authorizing Provider  cetirizine (ZYRTEC) 10 MG tablet Take 10 mg by mouth every morning.  12/12/17  Yes [provider]  loratadine (CLARITIN) 10 MG tablet Take 10 mg by mouth every evening.   Yes [provider]  predniSONE (DELTASONE) 10 MG tablet Take 10-40 mg by mouth daily. 40mg  x 3 days, 30mg  x 3 days, 20mg  x 3 days, 10mg  x 3 days 02/09/18 02/21/18 Yes  [provider]  terbinafine (LAMISIL) 250 MG tablet Take 250 mg by mouth daily. 12/07/17  Yes [provider]  HYDROcodone-acetaminophen (NORCO) 5-325 MG tablet Take 1-2 tablets by mouth every 6 (six) hours as needed for up to 10 doses for moderate pain. Patient not taking: Reported on 02/21/2018 01/05/18   Sung Amabile, DO  ibuprofen (ADVIL,MOTRIN) 200 MG tablet Take 4 tablets (800 mg total) by mouth 3 (three) times daily. Patient not taking: Reported on 02/21/2018 01/05/18   Sung Amabile, DO  NAPROXEN DR 500 MG EC tablet Take 1 tablet by mouth 2  (two) times daily as needed. 02/08/18   [provider]  PROAIR HFA 108 (90 Base) MCG/ACT inhaler Inhale 2 puffs into the lungs every 6 (six) hours as needed. For wheezing. 04/25/15   [provider]  SYMBICORT 160-4.5 MCG/ACT inhaler Inhale 2 puffs into the lungs 2 (two) times daily. 07/05/15   [provider]      VITAL SIGNS:  Blood pressure (!) 149/95, pulse (!) 101, temperature 98.2 F (36.8 C), temperature source Oral, resp. rate (!) 30, height 5\' 8"  (1.727 m), weight 118.1 kg, SpO2 96 %.  PHYSICAL EXAMINATION:  GENERAL:  47 y.o.-year-old patient lying in the bed with no acute distress.  EYES: Pupils equal, round, reactive to light and accommodation. No scleral icterus. Extraocular muscles intact.  HEENT: Head atraumatic, normocephalic. Oropharynx and nasopharynx clear.  NECK:  Supple, no jugular venous distention. No thyroid enlargement, no tenderness.  LUNGS: Normal breath sounds bilaterally, no wheezing, rales,rhonchi.  Crepitations are present  No use of accessory muscles of respiration.  CARDIOVASCULAR: S1, S2 normal. No murmurs, rubs, or gallops.  ABDOMEN: Soft, nontender, nondistended. Bowel sounds present. No organomegaly or mass.  EXTREMITIES: No pedal edema, cyanosis, or clubbing.  NEUROLOGIC: Cranial nerves II through XII are intact. Muscle strength 5/5 in all extremities. Sensation intact. Gait not checked.  PSYCHIATRIC: The patient is alert and oriented x 3.  SKIN: Macular papular rash which is erythematous is noticed below the knees no pustules no vesicles   LABORATORY PANEL:   CBC Recent Labs  Lab 02/21/18 1539  WBC 17.5*  HGB 10.1*  HCT 33.5*  PLT 164   ------------------------------------------------------------------------------------------------------------------  Chemistries  Recent Labs  Lab 02/21/18 1539  NA 135  K 4.0  CL 104  CO2 22  GLUCOSE 132*  BUN 10  CREATININE 0.65  CALCIUM 8.6*  AST 30  ALT 34  ALKPHOS 86   BILITOT 0.5   ------------------------------------------------------------------------------------------------------------------  Cardiac Enzymes Recent Labs  Lab 02/21/18 1539  TROPONINI <0.03   ------------------------------------------------------------------------------------------------------------------  RADIOLOGY:  Dg Chest 2 View  Result Date: 02/21/2018 CLINICAL DATA:  Worsening shortness of breath. EXAM: CHEST - 2 VIEW COMPARISON:  Chest x-ray 09/03/2013. FINDINGS: Mediastinum hilar structures normal. Heart size stable. Diffuse bilateral prominent interstitial changes are noted consistent with an active process such as pneumonitis. Left base atelectasis. No pleural effusion or pneumothorax. Bilateral pleural thickening consistent with scarring. No acute bony abnormality. IMPRESSION: 1. Diffuse bilateral prominent interstitial changes noted suggesting an active process such as pneumonitis. 2.  Left base atelectasis. Electronically Signed   By: Maisie Fus  Register   On: 02/21/2018 16:01   Ct Chest W Contrast  Result Date: 02/21/2018 CLINICAL DATA:  Shortness of breath starting several weeks ago. Fluid overload. 25 pound weight loss over the past 6 weeks. EXAM: CT CHEST WITH CONTRAST TECHNIQUE: Multidetector CT imaging of the chest was performed during intravenous contrast administration. CONTRAST:  75mL OMNIPAQUE  IOHEXOL 300 MG/ML  SOLN COMPARISON:  02/21/2018 chest radiograph.  CT 07/26/2013. FINDINGS: Cardiovascular: Tortuous thoracic aorta. Normal heart size, without pericardial effusion. Lad coronary calcification suspected on image 67/2. No central pulmonary embolism, on this non-dedicated study. Pulmonary artery enlargement, outflow tract 3.3 cm. Mediastinum/Nodes: Left-sided thyroid enlargement is similar to on the prior. Multiple middle mediastinal nodes. These measure maximally 9 mm, not pathologic by size criteria. Right hilar node measures 10 mm and is also not pathologic by size  criteria. A small to moderate hiatal hernia. Lungs/Pleura: Left-sided pleural thickening, but no pleural fluid. Right greater than left peribronchovascular ground-glass nodularity. This is slightly upper lobe predominant. Upper Abdomen: Caudate lobe enlargement is chronic. Moderate hepatic steatosis. Normal imaged portions of the spleen, pancreas, adrenal glands, kidneys, gallbladder. Musculoskeletal: Remote sixth lateral left rib fracture. IMPRESSION: 1. Right greater than left and slightly upper lobe predominant peribronchovascular ground-glass nodularity. Favor infection, including atypical etiologies. In the appropriate clinical setting, subacute hypersensitivity pneumonitis could have a similar appearance. 2. Suspicion of significantly age advanced coronary artery calcification. Correlate with risk factors and consider medical therapy. 3. Small hiatal hernia. 4. Hepatic steatosis. Electronically Signed   By: Jeronimo Greaves M.D.   On: 02/21/2018 17:17    EKG:   Orders placed or performed during the hospital encounter of 02/21/18  . EKG 12-Lead  . EKG 12-Lead    IMPRESSION AND PLAN:    #Acute versus subacute pneumonitis Admit to MedSurg unit Sputum culture and sensitivity IV antibiotics Rocephin and azithromycin Bronchodilator treatments as needed Symptomatic treatment and cough suppressants as needed   #Possible vasculitis with lower extremity rash Patient just has finished steroid tapering dose Benadryl as needed ED physician has discussed with rheumatology Dr. Gavin Potters he is not recommending any steroids at this time.  Outpatient follow-up with rheumatology for further work-up Check sed rate, ANA and CRP Drug screen  #Obstructive sleep apnea continue CPAP nightly  #Chronic history of asthma no exacerbation at this time Continue Symbicort and albuterol as needed  #30 pounds weight gain in 6 weeks Could be from steroid use We will get echocardiogram and check BMP   All the  records are reviewed and case discussed with ED provider. Management plans discussed with the patient, family and they are in agreement.  CODE STATUS: fc   TOTAL TIME TAKING CARE OF THIS PATIENT: 43  minutes.   Note: This dictation was prepared with Dragon dictation along with smaller phrase technology. Any transcriptional errors that result from this process are unintentional.  Ramonita Lab M.D on 02/21/2018 at 5:43 PM  Between 7am to 6pm - Pager - [phone]  After 6pm go to [email or web address] - password [email or web address] [facility]  [place] [facility]  Office  [phone]  CC: Primary care physician; Raynelle Bring

## 2018-02-21 NOTE — ED Triage Notes (Signed)
Patient reports worsening shortness of breath that started several weeks ago. Also reports "holding fluid" in legs, and in abdomen and neck. Patient reports SOB worse with exertion. Also reports 25lb weight gain in 6 weeks. Reports history of CHF in family but denies previous diagnosis.

## 2018-02-21 NOTE — ED Notes (Signed)
Pt taken to CT.

## 2018-02-21 NOTE — Progress Notes (Signed)
Patient states yellow emesis usually each morning. Last episode 02/20/18.

## 2018-02-22 DIAGNOSIS — R079 Chest pain, unspecified: Secondary | ICD-10-CM

## 2018-02-22 LAB — URINALYSIS, COMPLETE (UACMP) WITH MICROSCOPIC
Bacteria, UA: NONE SEEN
Bilirubin Urine: NEGATIVE
GLUCOSE, UA: NEGATIVE mg/dL
HGB URINE DIPSTICK: NEGATIVE
Ketones, ur: NEGATIVE mg/dL
Leukocytes, UA: NEGATIVE
NITRITE: NEGATIVE
PH: 5 (ref 5.0–8.0)
Protein, ur: NEGATIVE mg/dL
Specific Gravity, Urine: 1.032 — ABNORMAL HIGH (ref 1.005–1.030)

## 2018-02-22 LAB — RESPIRATORY PANEL BY PCR
ADENOVIRUS-RVPPCR: NOT DETECTED
Bordetella pertussis: NOT DETECTED
CORONAVIRUS HKU1-RVPPCR: NOT DETECTED
CORONAVIRUS NL63-RVPPCR: NOT DETECTED
CORONAVIRUS OC43-RVPPCR: NOT DETECTED
Chlamydophila pneumoniae: NOT DETECTED
Coronavirus 229E: NOT DETECTED
Influenza A: NOT DETECTED
Influenza B: NOT DETECTED
MYCOPLASMA PNEUMONIAE-RVPPCR: NOT DETECTED
Metapneumovirus: NOT DETECTED
PARAINFLUENZA VIRUS 1-RVPPCR: NOT DETECTED
Parainfluenza Virus 2: NOT DETECTED
Parainfluenza Virus 3: NOT DETECTED
Parainfluenza Virus 4: NOT DETECTED
Respiratory Syncytial Virus: NOT DETECTED
Rhinovirus / Enterovirus: NOT DETECTED

## 2018-02-22 LAB — ECHOCARDIOGRAM COMPLETE
HEIGHTINCHES: 68 in
Weight: 4165.81 oz

## 2018-02-22 LAB — TROPONIN I

## 2018-02-22 LAB — SEDIMENTATION RATE: SED RATE: 9 mm/h (ref 0–15)

## 2018-02-22 LAB — PROCALCITONIN: Procalcitonin: <0.1 ng/mL

## 2018-02-22 LAB — STREP PNEUMONIAE URINARY ANTIGEN: STREP PNEUMO URINARY ANTIGEN: NEGATIVE

## 2018-02-22 MED ORDER — THIAMINE HCL 100 MG/ML IJ SOLN: 100.0000 mg | Freq: Every day | INTRAMUSCULAR | Status: DC

## 2018-02-22 MED ORDER — LORAZEPAM 1 MG PO TABS: 1.0000 mg | ORAL_TABLET | Freq: Four times a day (QID) | ORAL | Status: DC | PRN

## 2018-02-22 MED ORDER — ASPIRIN 81 MG PO CHEW
81.0000 mg | CHEWABLE_TABLET | Freq: Every day | ORAL | Status: DC
  Administered 2018-02-23: 81 mg via ORAL
  Filled 2018-02-22: qty 1

## 2018-02-22 MED ORDER — LORAZEPAM 2 MG/ML IJ SOLN: 1.0000 mg | Freq: Four times a day (QID) | INTRAMUSCULAR | Status: DC | PRN

## 2018-02-22 MED ORDER — HYDRALAZINE HCL 20 MG/ML IJ SOLN
10.0000 mg | INTRAMUSCULAR | Status: DC | PRN
  Administered 2018-02-22: 10 mg via INTRAVENOUS
  Filled 2018-02-22: qty 1

## 2018-02-22 MED ORDER — VITAMIN B-1 100 MG PO TABS
100.0000 mg | ORAL_TABLET | Freq: Every day | ORAL | Status: DC
  Administered 2018-02-23: 100 mg via ORAL
  Filled 2018-02-22: qty 1

## 2018-02-22 MED ORDER — FOLIC ACID 1 MG PO TABS
1.0000 mg | ORAL_TABLET | Freq: Every day | ORAL | Status: DC
  Administered 2018-02-23: 1 mg via ORAL
  Filled 2018-02-22: qty 1

## 2018-02-22 MED ORDER — ADULT MULTIVITAMIN W/MINERALS CH
1.0000 | ORAL_TABLET | Freq: Every day | ORAL | Status: DC
  Administered 2018-02-23: 1 via ORAL
  Filled 2018-02-22: qty 1

## 2018-02-22 NOTE — Progress Notes (Signed)
[place] at [facility]                                                                                                                                                                                  Patient Demographics   Evan Edwards, is a 47 y.o. male, DOB - March 04, 1971, XTG:[id number]  Admit date - 02/21/2018   Admitting Physician Nicholes Mango, MD  Outpatient Primary MD for the [facility] is [facility]   LOS - 1  Subjective: Patient presented with neck swelling hand swelling and some shortness of breath noted to have atypical pneumonitis patient states that he is feeling better swelling is Occasional chest pain   Review of Systems:   CONSTITUTIONAL: No documented fever. No fatigue, weakness. No weight gain, no weight loss.  EYES: No blurry or double vision.  ENT: No tinnitus. No postnasal drip. No redness of the oropharynx.  RESPIRATORY: No cough, no wheeze, no hemoptysis.  Positive dyspnea.  CARDIOVASCULAR: No chest pain. No orthopnea. No palpitations. No syncope.  GASTROINTESTINAL: No nausea, no vomiting or diarrhea. No abdominal pain. No melena or hematochezia.  GENITOURINARY: No dysuria or hematuria.  ENDOCRINE: No polyuria or nocturia. No heat or cold intolerance.  HEMATOLOGY: No anemia. No bruising. No bleeding.  INTEGUMENTARY: No rashes. No lesions.  MUSCULOSKELETAL: No arthritis. No swelling. No gout.  NEUROLOGIC: No numbness, tingling, or ataxia. No seizure-type activity.  PSYCHIATRIC: No anxiety. No insomnia. No ADD.    Vitals:   Vitals:   02/21/18 1828 02/21/18 2045 02/22/18 0505 02/22/18 1137  BP: (!) 142/96 (!) 152/93 (!) 144/91 (!) 148/91  Pulse: (!) 102 89 84 81  Resp: _0 Temp: 98.3 F (36.8 C) 98.4 F (36.9 C) 98.4 F (36.9 C) 98.6 F (37 C)  TempSrc: Oral Oral Oral Oral  SpO2: 95% 95% 98% 98%  Weight:      Height:        Wt Readings from Last 3 Encounters:  02/21/18 118.1 kg  01/05/18 104.6 kg   07/20/15 93.9 kg     Intake/Output Summary (Last 24 hours) at 02/22/2018 1443 Last data filed at 02/22/2018 2703 Gross per 24 hour  Intake -  Output 0 ml  Net 0 ml    Physical Exam:   GENERAL: Pleasant-appearing in no apparent distress.  HEAD, EYES, EARS, NOSE AND THROAT: Atraumatic, normocephalic. Extraocular muscles are intact. Pupils equal and reactive to light. Sclerae anicteric. No conjunctival injection. No oro-pharyngeal erythema.  NECK: Supple. There is no jugular venous distention. No bruits, no lymphadenopathy, no thyromegaly.  HEART: Regular rate and rhythm,. No murmurs, no rubs, no clicks.  LUNGS: Rhonchus breath sounds  bilaterally no accessory muscle use ABDOMEN: Soft, flat, nontender, nondistended. Has good bowel sounds. No hepatosplenomegaly appreciated.  EXTREMITIES: No evidence of any cyanosis, clubbing, or peripheral edema.  +2 pedal and radial pulses bilaterally.  NEUROLOGIC: The patient is alert, awake, and oriented x3 with no focal motor or sensory deficits appreciated bilaterally.  SKIN: Moist and warm with no rashes appreciated.  Psych: Not anxious, depressed LN: No inguinal LN enlargement    Antibiotics   Anti-infectives (From admission, onward)   Start     Dose/Rate Route Frequency Ordered Stop   02/22/18 1000  terbinafine (LAMISIL) tablet 250 mg     250 mg Oral Daily 02/21/18 1823     02/21/18 1900  cefTRIAXone (ROCEPHIN) 1 g in sodium chloride 0.9 % 100 mL IVPB     1 g 200 mL/hr over 30 Minutes Intravenous Every 24 hours 02/21/18 1823 02/28/18 1859   02/21/18 1900  azithromycin (ZITHROMAX) 500 mg in sodium chloride 0.9 % 250 mL IVPB     500 mg 250 mL/hr over 60 Minutes Intravenous Every 24 hours 02/21/18 1823 02/28/18 1859      Medications   Scheduled Meds: . aspirin  81 mg Oral Daily  . enoxaparin (LOVENOX) injection  40 mg Subcutaneous Q24H  . loratadine  10 mg Oral QPM  . mometasone-formoterol  2 puff Inhalation BID  . pantoprazole  40 mg  Oral Daily  . terbinafine  250 mg Oral Daily   Continuous Infusions: . azithromycin 500 mg (02/21/18 2125)  . cefTRIAXone (ROCEPHIN)  IV 1 g (02/21/18 2010)   PRN Meds:.albuterol, diphenhydrAMINE, naproxen   Data Review:   Micro Results Recent Results (from the past 240 hour(s))  Culture, blood (routine x 2) Call MD if unable to obtain prior to antibiotics being given     Status: None (Preliminary result)   Collection Time: 02/21/18  6:59 PM  Result Value Ref Range Status   Specimen Description BLOOD RIGHT AC  Final   Special Requests   Final    BOTTLES DRAWN AEROBIC AND ANAEROBIC Blood Culture results may not be optimal due to an excessive volume of blood received in culture bottles   Culture   Final    NO GROWTH < 12 HOURS Performed at [facility], [address], [place], [place] [address]    Report Status PENDING  Incomplete  Culture, blood (routine x 2) Call MD if unable to obtain prior to antibiotics being given     Status: None (Preliminary result)   Collection Time: 02/21/18  7:01 PM  Result Value Ref Range Status   Specimen Description BLOOD LEFT HAND  Final   Special Requests   Final    BOTTLES DRAWN AEROBIC AND ANAEROBIC Blood Culture results may not be optimal due to an excessive volume of blood received in culture bottles   Culture   Final    NO GROWTH < 12 HOURS Performed at [facility], [address] [place], [place] [address]    Report Status PENDING  Incomplete    Radiology Reports Dg Chest 2 View  Result Date: 02/21/2018 CLINICAL DATA:  Worsening shortness of breath. EXAM: CHEST - 2 VIEW COMPARISON:  Chest x-ray 09/03/2013. FINDINGS: Mediastinum hilar structures normal. Heart size stable. Diffuse bilateral prominent interstitial changes are noted consistent with an active process such as pneumonitis. Left base atelectasis. No pleural effusion or pneumothorax. Bilateral pleural thickening consistent with scarring. No acute bony  abnormality. IMPRESSION: 1. Diffuse bilateral prominent interstitial changes noted  suggesting an active process such as pneumonitis. 2.  Left base atelectasis. Electronically Signed   By: Marcello Moores  Register   On: 02/21/2018 16:01   Ct Chest W Contrast  Result Date: 02/21/2018 CLINICAL DATA:  Shortness of breath starting several weeks ago. Fluid overload. 25 pound weight loss over the past 6 weeks. EXAM: CT CHEST WITH CONTRAST TECHNIQUE: Multidetector CT imaging of the chest was performed during intravenous contrast administration. CONTRAST:  20m OMNIPAQUE IOHEXOL 300 MG/ML  SOLN COMPARISON:  02/21/2018 chest radiograph.  CT 07/26/2013. FINDINGS: Cardiovascular: Tortuous thoracic aorta. Normal heart size, without pericardial effusion. Lad coronary calcification suspected on image 67/2. No central pulmonary embolism, on this non-dedicated study. Pulmonary artery enlargement, outflow tract 3.3 cm. Mediastinum/Nodes: Left-sided thyroid enlargement is similar to on the prior. Multiple middle mediastinal nodes. These measure maximally 9 mm, not pathologic by size criteria. Right hilar node measures 10 mm and is also not pathologic by size criteria. A small to moderate hiatal hernia. Lungs/Pleura: Left-sided pleural thickening, but no pleural fluid. Right greater than left peribronchovascular ground-glass nodularity. This is slightly upper lobe predominant. Upper Abdomen: Caudate lobe enlargement is chronic. Moderate hepatic steatosis. Normal imaged portions of the spleen, pancreas, adrenal glands, kidneys, gallbladder. Musculoskeletal: Remote sixth lateral left rib fracture. IMPRESSION: 1. Right greater than left and slightly upper lobe predominant peribronchovascular ground-glass nodularity. Favor infection, including atypical etiologies. In the appropriate clinical setting, subacute hypersensitivity pneumonitis could have a similar appearance. 2. Suspicion of significantly age advanced coronary artery calcification.  Correlate with risk factors and consider medical therapy. 3. Small hiatal hernia. 4. Hepatic steatosis. Electronically Signed   By: KAbigail MiyamotoM.D.   On: 02/21/2018 17:17     CBC Recent Labs  Lab 02/21/18 1539  WBC 17.5*  HGB 10.1*  HCT 33.5*  PLT 164  MCV 63.6*  MCH 19.3*  MCHC 30.3*  RDW 20.4*  LYMPHSABS 0.9*  MONOABS 0.9  EOSABS 0.1  BASOSABS 0.2*    Chemistries  Recent Labs  Lab 02/21/18 1539  NA 135  K 4.0  CL 104  CO2 22  GLUCOSE 132*  BUN 10  CREATININE 0.65  CALCIUM 8.6*  AST 30  ALT 34  ALKPHOS 86  BILITOT 0.5   ------------------------------------------------------------------------------------------------------------------ estimated creatinine clearance is 142.6 mL/min (by C-G formula based on SCr of 0.65 mg/dL). ------------------------------------------------------------------------------------------------------------------ No results for input(s): HGBA1C in the last 72 hours. ------------------------------------------------------------------------------------------------------------------ No results for input(s): CHOL, HDL, LDLCALC, TRIG, CHOLHDL, LDLDIRECT in the last 72 hours. ------------------------------------------------------------------------------------------------------------------ No results for input(s): TSH, T4TOTAL, T3FREE, THYROIDAB in the last 72 hours.  Invalid input(s): FREET3 ------------------------------------------------------------------------------------------------------------------ No results for input(s): VITAMINB12, FOLATE, FERRITIN, TIBC, IRON, RETICCTPCT in the last 72 hours.  Coagulation profile Recent Labs  Lab 02/21/18 1539  INR 1.01    No results for input(s): DDIMER in the last 72 hours.  Cardiac Enzymes Recent Labs  Lab 02/21/18 1539  TROPONINI <0.03   ------------------------------------------------------------------------------------------------------------------ Invalid input(s):  POCBNP    Assessment & Plan   #Acute versus subacute pneumonitis Possible hypersensitivity pneumonitis continue antibiotics for now I have asked pulmonary to see I have   hypersensitivity pneumonitis panel   #Possible vasculitis with lower extremity rash Patient just has finished steroid tapering dose Benadryl as needed ESR is normal ANA pending Rash could be related to Bactrim that he recently took  #Obstructive sleep apnea continue CPAP nightly  #Chronic history of asthma no exacerbation at this time Continue Symbicort and albuterol as needed  #30 pounds weight gain in 6  weeks echoardiogram pending  #Patient complains of chest pain CT suggestive of severe coronary calcifications his father died of age 47 he wants cardiology evaluation     Code Status Orders  (From admission, onward)         Start     Ordered   02/21/18 1824  Full code  Continuous     02/21/18 1823        Code Status History    This patient has a current code status but no historical code status.           Consults  pulm   DVT Prophylaxis  Lovenox   Lab Results  Component Value Date   PLT 164 02/21/2018     Time Spent in minutes  48mn Greater than 50% of time spent in care coordination and counseling patient regarding the condition and plan of care.   SDustin FlockM.D on 02/22/2018 at 2:43 PM  Between 7am to 6pm - Pager - [phone]  After 6pm go to [email or web address] - [email or web address]   Office  [phone]

## 2018-02-22 NOTE — Progress Notes (Signed)
Dr. Tobi Bastos notified of: elevated BP 162/104, pulse 87, no home BP meds and no PRN meds; acknowledged; new order written. Windy Carina, RN 6:20 PM 02/22/2018

## 2018-02-22 NOTE — Consult Note (Signed)
[facility] Cardiology  CARDIOLOGY CONSULT NOTE  Patient ID: Evan Edwards MRN: [id number] DOB/AGE: 07/29/1970 47 y.o.  Admit date: 02/21/2018 Referring Physician Allena Katz Primary Physician [facility] Primary Cardiologist None per patient Reason for Consultation Chest pain  HPI: 47 year old male referred for evaluation of chest pain.  The patient has a history of chronic asthma, beta thalassemia, obstructive sleep apnea, compliant with CPAP, alcohol abuse, and GERD.  The patient presented to [facility] ER yesterday for a recent history of night sweats, shortness of breath, coughing, 30 pound weight gain 6 weeks, neck swelling and rash on his lower extremities.  He was currently being treated for prednisone due to back pain.  Chest CT revealed suspicion of significantly age-advanced coronary artery calcification, and suspicion for pneumonitis with right greater than left and slightly upper lobe predominant peribronchovascular groundglass nodularity, favoring infection.  Patient was treated for possible vasculitis.  While lying in bed this afternoon, the patient reported experiencing a difficult-to-describe left-sided chest discomfort, both pressure-like and aching in nature with bilateral hand tingling, which is brief in nature, comes and goes within minutes, without associated shortness of breath, diaphoresis, or nausea.  He denies a personal cardiac history.  The patient is active at work, and states that he walks 25,000 steps per day.  He denies exertional chest pain.  He has mild exertional shortness of breath. Due to the severe coronary calcifications noted on chest CT and the fact that his father had CAD and died at the age of 28 (also with history of alcohol and tobacco abuse), the patient requested a cardiology evaluation. ECG yesterday revealed sinus rhythm without acute ST or T wave abnormalities.  Initial troponin yesterday was less than 0.03.  Repeat troponin is pending.  Admission labs also notable for  CRP elevated to 6.4, hemoglobin 10.7, hematocrit 34.8, MCV 63.9.  The patient reports that he drinks a 12 beers daily.    Review of systems complete and found to be negative unless listed above     Past Medical History:  Diagnosis Date  . Anemia   . Asthma   . Bacterial pneumonia 2014  . Beta thalassemia (HCC)   . Emphysema lung (HCC)   . GERD (gastroesophageal reflux disease)   . Sleep apnea    uses cpap    Past Surgical History:  Procedure Laterality Date  . APPENDECTOMY    . Bacterial Pneumonia Surgery     Pt unable to state what exactly was done, states "they went inside of removed it".  . UMBILICAL HERNIA REPAIR N/A 01/05/2018   Procedure: HERNIA REPAIR UMBILICAL ADULT WITH MESH;  Surgeon: Sung Amabile, DO;  Location: [facility] ORS;  Service: General;  Laterality: N/A;  . UPPER GI ENDOSCOPY      Medications Prior to Admission  Medication Sig Dispense Refill Last Dose  . cetirizine (ZYRTEC) 10 MG tablet Take 10 mg by mouth every morning.   5 02/21/2018 at Unknown time  . loratadine (CLARITIN) 10 MG tablet Take 10 mg by mouth every evening.   02/20/2018 at Unknown time  . [EXPIRED] predniSONE (DELTASONE) 10 MG tablet Take 10-40 mg by mouth daily. 40mg  x 3 days, 30mg  x 3 days, 20mg  x 3 days, 10mg  x 3 days  0 02/21/2018 at Unknown time  . terbinafine (LAMISIL) 250 MG tablet Take 250 mg by mouth daily.  2 02/21/2018 at Unknown time  . HYDROcodone-acetaminophen (NORCO) 5-325 MG tablet Take 1-2 tablets by mouth every 6 (six) hours as needed for up to 10 doses  for moderate pain. (Patient not taking: Reported on 02/21/2018) 30 tablet 0 Not Taking at Unknown time  . ibuprofen (ADVIL,MOTRIN) 200 MG tablet Take 4 tablets (800 mg total) by mouth 3 (three) times daily. (Patient not taking: Reported on 02/21/2018) 30 tablet 0 Not Taking at Unknown time  . NAPROXEN DR 500 MG EC tablet Take 1 tablet by mouth 2 (two) times daily as needed.  0 PRN at PRN  . PROAIR HFA 108 (90 Base) MCG/ACT inhaler Inhale 2  puffs into the lungs every 6 (six) hours as needed. For wheezing.  5 PRN at PRN  . SYMBICORT 160-4.5 MCG/ACT inhaler Inhale 2 puffs into the lungs 2 (two) times daily.  11 PRN at PRN   Social History   Socioeconomic History  . Marital status: Single    Spouse name: Not on file  . Number of children: Not on file  . Years of education: Not on file  . Highest education level: Not on file  Occupational History  . Not on file  Social Needs  . Financial resource strain: Not on file  . Food insecurity:    Worry: Not on file    Inability: Not on file  . Transportation needs:    Medical: Not on file    Non-medical: Not on file  Tobacco Use  . Smoking status: Never Smoker  . Smokeless tobacco: Former Engineer, water and Sexual Activity  . Alcohol use: Yes    Alcohol/week: 4.0 standard drinks    Types: 4 Cans of beer per week  . Drug use: No  . Sexual activity: Not on file  Lifestyle  . Physical activity:    Days per week: Not on file    Minutes per session: Not on file  . Stress: Not on file  Relationships  . Social connections:    Talks on phone: Not on file    Gets together: Not on file    Attends religious service: Not on file    Active member of club or organization: Not on file    Attends meetings of clubs or organizations: Not on file    Relationship status: Not on file  . Intimate partner violence:    Fear of current or ex partner: Not on file    Emotionally abused: Not on file    Physically abused: Not on file    Forced sexual activity: Not on file  Other Topics Concern  . Not on file  Social History Narrative  . Not on file    No family history on file.    Review of systems complete and found to be negative unless listed above      PHYSICAL EXAM  General: Well developed, well nourished, in no acute distress HEENT:  Normocephalic and atramatic Neck:  No JVD.  Lungs: normal effort of breathing on room air Heart: HRRR . Normal S1 and S2 without gallops  or murmurs.  Abdomen: Bowel sounds are positive, abdomen soft and non-tender  Msk:  No obvious deformity Extremities: No clubbing, cyanosis or edema.   Neuro: Alert and oriented X 3. Psych:  responds appropriately  Labs:   Lab Results  Component Value Date   WBC 17.5 (H) 02/21/2018   HGB 10.1 (L) 02/21/2018   HCT 33.5 (L) 02/21/2018   MCV 63.6 (L) 02/21/2018   PLT 164 02/21/2018    Recent Labs  Lab 02/21/18 1539  NA 135  K 4.0  CL 104  CO2 22  BUN 10  CREATININE 0.65  CALCIUM 8.6*  PROT 7.3  BILITOT 0.5  ALKPHOS 86  ALT 34  AST 30  GLUCOSE 132*   Lab Results  Component Value Date   TROPONINI <0.03 02/21/2018   No results found for: CHOL No results found for: HDL No results found for: LDLCALC No results found for: TRIG No results found for: CHOLHDL No results found for: LDLDIRECT    Radiology: Dg Chest 2 View  Result Date: 02/21/2018 CLINICAL DATA:  Worsening shortness of breath. EXAM: CHEST - 2 VIEW COMPARISON:  Chest x-ray 09/03/2013. FINDINGS: Mediastinum hilar structures normal. Heart size stable. Diffuse bilateral prominent interstitial changes are noted consistent with an active process such as pneumonitis. Left base atelectasis. No pleural effusion or pneumothorax. Bilateral pleural thickening consistent with scarring. No acute bony abnormality. IMPRESSION: 1. Diffuse bilateral prominent interstitial changes noted suggesting an active process such as pneumonitis. 2.  Left base atelectasis. Electronically Signed   By: Thomas  Register   On: 02/21/2018 16:01   Ct Chest W Contrast  Result Date: 02/21/2018 CLINICAL DATA:  Shortness of breath starting several weeks ago. Fluid overload. 25 pound weight loss over the past 6 weeks. EXAM: CT CHEST WITH CONTRAST TECHNIQUE: Multidetector CT imaging of the chest was performed during intravenous contrast administration. CONTRAST:  2Marland [id number] Kitchenm3323 064Marland Kitchenm04.604941D352-124Marland Kitchenm84.604351Rus951 535Marland Kitchenm44.604231Twin 202-343Marland Kitchenm44.604461Hattie443-238Marland Kitchenm84.604471[phone] Kitchenm34.604851[facility]570-889Marland Kitchenm54.604661Da([phone] Kitchenm34.604571S26769Marland Kitchenm34.60461Sharp Ch26055Marland Kitchenm14.604691[facility] Ho31964Marland Kitchenm44.60474(332) 092Marland Kitchenm84.633112Marland Kitchenm94.604911Samuel [phone])843Marland Kitchenm84.604191[place]85955Marland Kitchenm94.6279-555Marland Kitchenm74.604131[place] [facility]810-635Sheral Apleyalth CenKoreaterStatisticianARISON:  02/21/2018 chest radiograph.  CT  07/26/2013. FINDINGS: Cardiovascular: Tortuous thoracic aorta. Normal heart size, without pericardial effusion. Lad coronary calcification suspected on image 67/2. No central pulmonary embolism, on this non-dedicated study. Pulmonary artery enlargement, outflow tract 3.3 cm. Mediastinum/Nodes: Left-sided thyroid enlargement is similar to on the prior. Multiple middle mediastinal nodes. These measure maximally 9 mm, not pathologic by size criteria. Right hilar node measures 10 mm and is also not pathologic by size criteria. A small to moderate hiatal hernia. Lungs/Pleura: Left-sided pleural thickening, but no pleural fluid. Right greater than left peribronchovascular ground-glass nodularity. This is slightly upper lobe predominant. Upper Abdomen: Caudate lobe enlargement is chronic. Moderate hepatic steatosis. Normal imaged portions of the spleen, pancreas, adrenal glands, kidneys, gallbladder. Musculoskeletal: Remote sixth lateral left rib fracture. IMPRESSION: 1. Right greater than left and slightly upper lobe predominant peribronchovascular ground-glass nodularity. Favor infection, including atypical etiologies. In the appropriate clinical setting, subacute hypersensitivity pneumonitis could have a similar appearance. 2. Suspicion of significantly age advanced coronary artery calcification. Correlate with risk factors and consider medical therapy. 3. Small hiatal hernia. 4. Hepatic steatosis. Electronically Signed   By: Kyle  Talbot M.D.   On: 02/21/2018 17:17    EKG: Sinus rhythm  ASSESSMENT AND PLAN:  1. Chest pain, atypical, nonexertional, with severe age-advanced coronary calcifications noted on chest CT. Initial troponin less than 0.03. Repeat troponin pending. ECG yesterday without acute ST-T wave abnormalities.  2. Acute pneumonitis, on antibiotics 3. Possible vasculitis 4. Alcohol abuse, the patient states he drinks 12-pack of beer daily 5. Obstructive sleep apnea, compliant with CPAP 6.  30-pound weight gain over 6 weeks; pending echocardiogram 7. Anemia, hemoglobin 10.7, hematocrit 34.8, MCV 63.9  Recommendations: 1. Review 2D echocardiogram 2. Continue antibiotics for treatment of pneumonitis 3. Strongly encouraged patient to abstain from alcohol 4. Recommend CIWA protocol  5. Lipid panel 6. ECG now    Signed: Franny Selvage PA-C 02/22/2018, 3:27 PM

## 2018-02-23 LAB — ANA COMPREHENSIVE PANEL
CHROMATIN AB SERPL-ACNC: 0.2 AI (ref 0.0–0.9)
Centromere Ab Screen: <0.2 AI (ref 0.0–0.9)
ENA SM Ab Ser-aCnc: <0.2 AI (ref 0.0–0.9)
Ribonucleic Protein: <0.2 AI (ref 0.0–0.9)
SSA (Ro) (ENA) Antibody, IgG: <0.2 AI (ref 0.0–0.9)
SSB (La) (ENA) Antibody, IgG: <0.2 AI (ref 0.0–0.9)

## 2018-02-23 LAB — LIPID PANEL
CHOLESTEROL: 199 mg/dL (ref 0–200)
HDL: 44 mg/dL (ref >40)
LDL Cholesterol: 124 mg/dL — ABNORMAL HIGH (ref 0–99)
TRIGLYCERIDES: 156 mg/dL — AB (ref <150)
Total CHOL/HDL Ratio: 4.5 RATIO
VLDL: 31 mg/dL (ref 0–40)

## 2018-02-23 LAB — LEGIONELLA PNEUMOPHILA SEROGP 1 UR AG: L. PNEUMOPHILA SEROGP 1 UR AG: NEGATIVE

## 2018-02-23 LAB — ANA: ANA: NEGATIVE

## 2018-02-23 LAB — HIV ANTIBODY (ROUTINE TESTING W REFLEX): HIV Screen 4th Generation wRfx: NONREACTIVE

## 2018-02-23 MED ORDER — CEFUROXIME AXETIL 500 MG PO TABS: 500.0000 mg | ORAL_TABLET | Freq: Two times a day (BID) | ORAL | 0 refills | Status: AC

## 2018-02-23 MED ORDER — AZITHROMYCIN 500 MG PO TABS: 500.0000 mg | ORAL_TABLET | Freq: Every day | ORAL | 0 refills | Status: AC

## 2018-02-23 MED ORDER — ASPIRIN 81 MG PO CHEW: 81.0000 mg | CHEWABLE_TABLET | Freq: Every day | ORAL | Status: DC

## 2018-02-23 MED ORDER — PREDNISONE 20 MG PO TABS: 40.0000 mg | ORAL_TABLET | Freq: Every day | ORAL | 0 refills | Status: AC

## 2018-02-23 MED ORDER — FUROSEMIDE 20 MG PO TABS: 20.0000 mg | ORAL_TABLET | Freq: Two times a day (BID) | ORAL | 11 refills | Status: DC

## 2018-02-23 NOTE — Progress Notes (Signed)
[employer] [facility] [place] at [facility] was admitted to the Hospital on 02/21/2018 and Discharged  02/23/2018 and should be excused from work/school   for 6 days starting 02/21/2018 , may return to work/school without any restrictions.  Call Auburn Bilberry MD with questions.  Auburn Bilberry M.D on 02/23/2018,at 12:26 PM  [employer] [facility] [place] at [facility]  [phone]

## 2018-02-23 NOTE — Progress Notes (Signed)
Date: 02/23/2018,   MRN# [id number] MARKEITH JUE [birth year] Code Status:     Code Status Orders  (From admission, onward)         Start     Ordered   02/21/18 1824  Full code  Continuous     02/21/18 1823        Code Status History    This patient has a current code status but no historical code status.     Hosp day:@LENGTHOFSTAYDAYS @ Referring MD: @ATDPROV @      CC: Abnormal chest ct scan  HPI: This is a 47 yr old male, known to [place] came in with increase swelling in legs ( 30 lb weight gain in 3 months). There was also icthy rash on the lower extremities. Chest ct shoed bilateral (R>L) ground glass infiltrates. No fever or rashes. No eye changes or nodes.   PMHX:   Past Medical History:  Diagnosis Date  . Anemia   . Asthma   . Bacterial pneumonia 2014  . Beta thalassemia (HCC)   . Emphysema lung (HCC)   . GERD (gastroesophageal reflux disease)   . Sleep apnea    uses cpap   Surgical Hx:  Past Surgical History:  Procedure Laterality Date  . APPENDECTOMY    . Bacterial Pneumonia Surgery     Pt unable to state what exactly was done, states "they went inside of removed it".  . UMBILICAL HERNIA REPAIR N/A 01/05/2018   Procedure: HERNIA REPAIR UMBILICAL ADULT WITH MESH;  Surgeon: Sung Amabile, DO;  Location: [facility] ORS;  Service: General;  Laterality: N/A;  . UPPER GI ENDOSCOPY     Family Hx:  No family history on file. Social Hx:   Social History   Tobacco Use  . Smoking status: Never Smoker  . Smokeless tobacco: Former Engineer, water Use Topics  . Alcohol use: Yes    Alcohol/week: 4.0 standard drinks    Types: 4 Cans of beer per week  . Drug use: No   Medication:    Home Medication:  Current Outpatient Rx  . [START ON 02/24/2018] Order #: [id number] Class: OTC  . Order #: [id number] Class: Print  . Order #: [id number] Class: Print  . Order #: [id number] Class: Print  . Order #: [id number] Class: Print    Current Medication: @CURMEDTAB @   Allergies:   Patient has no known allergies.  Review of Systems: Gen:  Denies  fever, sweats, chills HEENT: Denies blurred vision, double vision, ear pain, eye pain, hearing loss, nose bleeds, sore throat Cvc:  No dizziness, chest pain or heaviness Resp:    Gi: Denies swallowing difficulty, stomach pain, nausea or vomiting, diarrhea, constipation, bowel incontinence Gu:  Denies bladder incontinence, burning urine Ext:   No Joint pain, stiffness or swelling Skin: No skin rash, easy bruising or bleeding or hives Endoc:  No polyuria, polydipsia , polyphagia or weight change Psych: No depression, insomnia or hallucinations  Other:  All other systems negative  Physical Examination:   VS: BP (!) 150/95 (BP Location: Right Arm)   Pulse 85   Temp 98.4 F (36.9 C) (Oral)   Resp 18   Ht 5\' 8"  (1.727 m)   Wt 118.1 kg   SpO2 95%   BMI 39.59 kg/m   General Appearance: No distress  Neuro: without focal findings, mental status, speech normal, alert and oriented, cranial nerves 2-12 intact, reflexes normal and symmetric, sensation grossly normal  HEENT: PERRLA, EOM intact, no ptosis, no other lesions noticed, Mallampati: Pulmonary:.No wheezing, No rales  Sputum Production:   Cardiovascular:  Normal S1,S2.  No m/r/g.  Abdominal aorta pulsation normal.    Abdomen:Benign, Soft, non-tender, No masses, hepatosplenomegaly, No lymphadenopathy Endoc: No evident thyromegaly, no signs of acromegaly or Cushing features Skin:   warm, no rashes, no ecchymosis  Extremities: normal, no cyanosis, clubbing, no edema, warm with normal capillary refill. Other findings:   Labs results:   Recent Labs    02/21/18 1539  HGB 10.1*  HCT 33.5*  MCV 63.6*  WBC 17.5*  BUN 10  CREATININE 0.65  GLUCOSE 132*  CALCIUM 8.6*  INR 1.01  ,  Anti Nuclear Antibody(ANA) Negative Negative   Comment: (NOTE)   Hiv titer - ve   Rad results:        Assessment and Plan: Full note to foolow. OK TO GO HOME OUT PATIENT F/U  CXR AND continued w/e   I have personally obtained a history, examined the patient, evaluated laboratory and imaging results, formulated the assessment and plan and placed orders.  The Patient requires high complexity decision making for assessment and support, frequent evaluation and titration of therapies, application of advanced monitoring technologies and extensive interpretation of multiple databases.   Giavanni Odonovan,M.D. Pulmonary & Critical care Medicine [facility]

## 2018-02-23 NOTE — Discharge Summary (Addendum)
[employer] - [place] at [facility] summary   [address] [place], 47 y.o., DOB December 05, 1970, MRN [id number]. Admission date: 02/21/2018 Discharge Date 02/23/2018 Primary MD Raynelle Bring Admitting Physician Ramonita Lab, MD  Admission Diagnosis  Shortness of breath [R06.02] Vasculitis (HCC) [I77.6] Peripheral edema [R60.9] Pneumonitis [J18.9]  Discharge Diagnosis   Active Problems: Atypical pneumonitis Lower extremity rash which is fading could be related to recent antibiotics with back Obstructive sleep Chronic history of asthma Possible diastolic CHF based on echo Chest pain with severe coronary calcifications outpatient follow-up for possible stress test   Hospital Course Evan Edwards  is a 47 y.o. male with a known history of chronic asthma, beta thalassemia, GERD, obstructive sleep apnea on CPAP machine nightly is presenting to the ED with a chief complaint of night sweats, shortness of breath and coughing.  Patient also had a rash in his lower extremity.  He was admitted and started on antibiotics.  Work-up included hypersensitivity pneumonitis panel respiratory virus panel.  Respiratory virus panel was negative.  He was started on oral prednisone and antibiotics was seen by pulmonary he will need outpatient follow-up.  He also had a echocardiogram which showed diastolic dysfunction.  He is started on oral Lasix his primary care provider needs to monitor his BMP and potassium.  Patient also was having some chest pain CT suggested a coronary calcification was seen by cardiology will need outpatient stress test possibly.            Consults  cardiology, pulm  Significant Tests:  See full reports for all details   35 minutes Dg Chest 2 View  Result Date: 02/21/2018 CLINICAL DATA:  Worsening shortness of breath. EXAM: CHEST - 2 VIEW COMPARISON:  Chest x-ray 09/03/2013. FINDINGS: Mediastinum hilar structures normal. Heart size stable.  Diffuse bilateral prominent interstitial changes are noted consistent with an active process such as pneumonitis. Left base atelectasis. No pleural effusion or pneumothorax. Bilateral pleural thickening consistent with scarring. No acute bony abnormality. IMPRESSION: 1. Diffuse bilateral prominent interstitial changes noted suggesting an active process such as pneumonitis. 2.  Left base atelectasis. Electronically Signed   By: Maisie Fus  Register   On: 02/21/2018 16:01   Ct Chest W Contrast  Result Date: 02/21/2018 CLINICAL DATA:  Shortness of breath starting several weeks ago. Fluid overload. 25 pound weight loss over the past 6 weeks. EXAM: CT CHEST WITH CONTRAST TECHNIQUE: Multidetector CT imaging of the chest was performed during intravenous contrast administration. CONTRAST:  75mL OMNIPAQUE IOHEXOL 300 MG/ML  SOLN COMPARISON:  02/21/2018 chest radiograph.  CT 07/26/2013. FINDINGS: Cardiovascular: Tortuous thoracic aorta. Normal heart size, without pericardial effusion. Lad coronary calcification suspected on image 67/2. No central pulmonary embolism, on this non-dedicated study. Pulmonary artery enlargement, outflow tract 3.3 cm. Mediastinum/Nodes: Left-sided thyroid enlargement is similar to on the prior. Multiple middle mediastinal nodes. These measure maximally 9 mm, not pathologic by size criteria. Right hilar node measures 10 mm and is also not pathologic by size criteria. A small to moderate hiatal hernia. Lungs/Pleura: Left-sided pleural thickening, but no pleural fluid. Right greater than left peribronchovascular ground-glass nodularity. This is slightly upper lobe predominant. Upper Abdomen: Caudate lobe enlargement is chronic. Moderate hepatic steatosis. Normal imaged portions of the spleen, pancreas, adrenal glands, kidneys, gallbladder. Musculoskeletal: Remote sixth lateral left rib fracture. IMPRESSION: 1. Right greater than left and slightly upper lobe predominant peribronchovascular ground-glass  nodularity. Favor infection, including atypical etiologies. In the appropriate clinical setting, subacute hypersensitivity pneumonitis could have a  similar appearance. 2. Suspicion of significantly age advanced coronary artery calcification. Correlate with risk factors and consider medical therapy. 3. Small hiatal hernia. 4. Hepatic steatosis. Electronically Signed   By: Jeronimo Greaves M.D.   On: 02/21/2018 17:17       Today   Subjective:   Evan Edwards patient feeling well denies any complaints wants to go home Objective:   Blood pressure (!) 150/95, pulse 85, temperature 98.4 F (36.9 C), temperature source Oral, resp. rate 18, height 5\' 8"  (1.727 m), weight 118.1 kg, SpO2 95 %.  .  Intake/Output Summary (Last 24 hours) at 02/23/2018 1428 Last data filed at 02/23/2018 0804 Gross per 24 hour  Intake 350 ml  Output 2550 ml  Net -2200 ml    Exam VITAL SIGNS: Blood pressure (!) 150/95, pulse 85, temperature 98.4 F (36.9 C), temperature source Oral, resp. rate 18, height 5\' 8"  (1.727 m), weight 118.1 kg, SpO2 95 %.  GENERAL:  47 y.o.-year-old patient lying in the bed with no acute distress.  EYES: Pupils equal, round, reactive to light and accommodation. No scleral icterus. Extraocular muscles intact.  HEENT: Head atraumatic, normocephalic. Oropharynx and nasopharynx clear.  NECK:  Supple, no jugular venous distention. No thyroid enlargement, no tenderness.  LUNGS: Normal breath sounds bilaterally, no wheezing, rales,rhonchi or crepitation. No use of accessory muscles of respiration.  CARDIOVASCULAR: S1, S2 normal. No murmurs, rubs, or gallops.  ABDOMEN: Soft, nontender, nondistended. Bowel sounds present. No organomegaly or mass.  EXTREMITIES: No pedal edema, cyanosis, or clubbing.  NEUROLOGIC: Cranial nerves II through XII are intact. Muscle strength 5/5 in all extremities. Sensation intact. Gait not checked.  PSYCHIATRIC: The patient is alert and oriented x 3.  SKIN: No  obvious rash, lesion, or ulcer.   Data Review     CBC w Diff:  Lab Results  Component Value Date   WBC 17.5 (H) 02/21/2018   HGB 10.1 (L) 02/21/2018   HGB 7.4 (L) 07/30/2013   HCT 33.5 (L) 02/21/2018   HCT 25.6 (L) 07/30/2013   PLT 164 02/21/2018   PLT 285 07/30/2013   LYMPHOPCT 5 02/21/2018   LYMPHOPCT 10.1 07/30/2013   MONOPCT 5 02/21/2018   MONOPCT 9.2 07/30/2013   EOSPCT 0 02/21/2018   EOSPCT 3.6 07/30/2013   BASOPCT 1 02/21/2018   BASOPCT 0.5 07/30/2013   CMP:  Lab Results  Component Value Date   NA 135 02/21/2018   NA 133 (L) 07/26/2013   K 4.0 02/21/2018   K 4.2 07/26/2013   CL 104 02/21/2018   CL 102 07/26/2013   CO2 22 02/21/2018   CO2 27 07/26/2013   BUN 10 02/21/2018   BUN 7 07/26/2013   CREATININE 0.65 02/21/2018   CREATININE 0.70 07/31/2013   PROT 7.3 02/21/2018   PROT 6.4 07/24/2013   ALBUMIN 3.8 02/21/2018   ALBUMIN 2.0 (L) 07/24/2013   BILITOT 0.5 02/21/2018   BILITOT 0.3 07/24/2013   ALKPHOS 86 02/21/2018   ALKPHOS 54 07/24/2013   AST 30 02/21/2018   AST 11 (L) 07/24/2013   ALT 34 02/21/2018   ALT 15 07/24/2013  .  Micro Results Recent Results (from the past 240 hour(s))  Culture, blood (routine x 2) Call MD if unable to obtain prior to antibiotics being given     Status: None (Preliminary result)   Collection Time: 02/21/18  6:59 PM  Result Value Ref Range Status   Specimen Description BLOOD RIGHT [facility]  Final   Special Requests   Final  BOTTLES DRAWN AEROBIC AND ANAEROBIC Blood Culture results may not be optimal due to an excessive volume of blood received in culture bottles   Culture   Final    NO GROWTH 2 DAYS Performed at [facility], [address] [place], [place] [address]    Report Status PENDING  Incomplete  Culture, blood (routine x 2) Call MD if unable to obtain prior to antibiotics being given     Status: None (Preliminary result)   Collection Time: 02/21/18  7:01 PM  Result Value Ref Range Status    Specimen Description BLOOD LEFT HAND  Final   Special Requests   Final    BOTTLES DRAWN AEROBIC AND ANAEROBIC Blood Culture results may not be optimal due to an excessive volume of blood received in culture bottles   Culture   Final    NO GROWTH 2 DAYS Performed at [facility], [address] [place], [place] [address]    Report Status PENDING  Incomplete  Respiratory Panel by PCR     Status: None   Collection Time: 02/22/18  1:06 PM  Result Value Ref Range Status   Adenovirus NOT DETECTED NOT DETECTED Final   Coronavirus 229E NOT DETECTED NOT DETECTED Final   Coronavirus HKU1 NOT DETECTED NOT DETECTED Final   Coronavirus NL63 NOT DETECTED NOT DETECTED Final   Coronavirus OC43 NOT DETECTED NOT DETECTED Final   Metapneumovirus NOT DETECTED NOT DETECTED Final   Rhinovirus / Enterovirus NOT DETECTED NOT DETECTED Final   Influenza A NOT DETECTED NOT DETECTED Final   Influenza B NOT DETECTED NOT DETECTED Final   Parainfluenza Virus 1 NOT DETECTED NOT DETECTED Final   Parainfluenza Virus 2 NOT DETECTED NOT DETECTED Final   Parainfluenza Virus 3 NOT DETECTED NOT DETECTED Final   Parainfluenza Virus 4 NOT DETECTED NOT DETECTED Final   Respiratory Syncytial Virus NOT DETECTED NOT DETECTED Final   Bordetella pertussis NOT DETECTED NOT DETECTED Final   Chlamydophila pneumoniae NOT DETECTED NOT DETECTED Final   Mycoplasma pneumoniae NOT DETECTED NOT DETECTED Final    Comment: Performed at [facility], [address] [place], [place] [address]        Code Status Orders  (From admission, onward)         Start     Ordered   02/21/18 1824  Full code  Continuous     02/21/18 1823        Code Status History    This patient has a current code status but no historical code status.          Follow-up Information    [facility] Follow up in 6 day(s).   Why:  hosp f/u Contact information: [address] [place]  [address] [phone]        Mertie Moores, MD Follow up on 02/27/2018.   Specialty:  Specialist Why:  hosp f/u atypical pna - Dr. Reita Cliche office will call you to schedule a followup visit.  Please call their office if you haven't heard anything by Friday afternoon.  [phone] Contact information: [address] [place] [address] [phone]        Marcina Millard, MD. Go on 02/27/2018.   Specialty:  Cardiology Why:  hosp f/u chest pain stress test - Dr. Darrold Junker or PA, Monday, 9/30 at 9:15 a.m.  [phone] Contact information: [address] [employer] [place] [address] [phone]  Discharge Medications   Allergies as of 02/23/2018   No Known Allergies     Medication List    STOP taking these medications   HYDROcodone-acetaminophen 5-325 MG tablet Commonly known as:  NORCO/VICODIN   ibuprofen 200 MG tablet Commonly known as:  ADVIL,MOTRIN     TAKE these medications   aspirin 81 MG chewable tablet Chew 1 tablet (81 mg total) by mouth daily. Start taking on:  02/24/2018   azithromycin 500 MG tablet Commonly known as:  ZITHROMAX Take 1 tablet (500 mg total) by mouth daily for 3 days.   cefUROXime 500 MG tablet Commonly known as:  CEFTIN Take 1 tablet (500 mg total) by mouth 2 (two) times daily for 5 days.   cetirizine 10 MG tablet Commonly known as:  ZYRTEC Take 10 mg by mouth every morning.   furosemide 20 MG tablet Commonly known as:  LASIX Take 1 tablet (20 mg total) by mouth 2 (two) times daily.   loratadine 10 MG tablet Commonly known as:  CLARITIN Take 10 mg by mouth every evening.   NAPROXEN DR 500 MG EC tablet Generic drug:  naproxen Take 1 tablet by mouth 2 (two) times daily as needed.   predniSONE 20 MG tablet Commonly known as:  DELTASONE Take 2 tablets (40 mg total) by mouth daily with breakfast for 5 days. What changed:    medication strength  how much  to take  when to take this  additional instructions   PROAIR HFA 108 (90 Base) MCG/ACT inhaler Generic drug:  albuterol Inhale 2 puffs into the lungs every 6 (six) hours as needed. For wheezing.   SYMBICORT 160-4.5 MCG/ACT inhaler Generic drug:  budesonide-formoterol Inhale 2 puffs into the lungs 2 (two) times daily.   terbinafine 250 MG tablet Commonly known as:  LAMISIL Take 250 mg by mouth daily.          Total Time in preparing paper work, data evaluation and todays exam - 35 minutes  Auburn Bilberry M.D on 02/23/2018 at 2:28 PM [employer]   Office  [phone]

## 2018-02-26 LAB — CULTURE, BLOOD (ROUTINE X 2)
CULTURE: NO GROWTH
CULTURE: NO GROWTH

## 2018-02-27 LAB — HYPERSENSITIVITY PNEUMONITIS
A. FUMIGATUS #1 ABS: NEGATIVE
A. PULLULANS ABS: NEGATIVE
Micropolyspora faeni, IgG: NEGATIVE
PIGEON SERUM ABS: NEGATIVE
THERMOACT. SACCHARII: NEGATIVE
Thermoactinomyces vulgaris, IgG: NEGATIVE

## 2018-03-20 ENCOUNTER — Other Ambulatory Visit: Payer: Self-pay | Admitting: Specialist

## 2018-03-20 DIAGNOSIS — J849 Interstitial pulmonary disease, unspecified: Secondary | ICD-10-CM

## 2018-03-27 ENCOUNTER — Ambulatory Visit
Admission: RE | Admit: 2018-03-27 | Discharge: 2018-03-27 | Disposition: A | Discharge status: Home / Self Care | Payer: 59 | Source: Ambulatory Visit | Attending: Specialist | Admitting: Specialist

## 2018-03-27 DIAGNOSIS — R918 Other nonspecific abnormal finding of lung field: Secondary | ICD-10-CM | POA: Insufficient documentation

## 2018-03-27 DIAGNOSIS — K760 Fatty (change of) liver, not elsewhere classified: Secondary | ICD-10-CM | POA: Diagnosis not present

## 2018-03-27 DIAGNOSIS — I700 Atherosclerosis of aorta: Secondary | ICD-10-CM | POA: Diagnosis not present

## 2018-03-27 DIAGNOSIS — J849 Interstitial pulmonary disease, unspecified: Secondary | ICD-10-CM | POA: Diagnosis not present

## 2018-07-04 ENCOUNTER — Ambulatory Visit
Admission: RE | Admit: 2018-07-04 | Discharge: 2018-07-04 | Disposition: A | Discharge status: Home / Self Care | Payer: Self-pay | Source: Ambulatory Visit | Attending: Family Medicine | Admitting: Family Medicine

## 2018-07-04 ENCOUNTER — Other Ambulatory Visit: Payer: Self-pay | Admitting: Family Medicine

## 2018-07-04 ENCOUNTER — Ambulatory Visit
Admission: RE | Admit: 2018-07-04 | Discharge: 2018-07-04 | Disposition: A | Discharge status: Home / Self Care | Payer: Worker's Compensation | Source: Ambulatory Visit | Attending: *Deleted | Admitting: *Deleted

## 2018-07-04 DIAGNOSIS — M545 Low back pain, unspecified: Secondary | ICD-10-CM

## 2018-08-21 ENCOUNTER — Other Ambulatory Visit: Payer: Self-pay | Admitting: Specialist

## 2018-08-21 DIAGNOSIS — J849 Interstitial pulmonary disease, unspecified: Secondary | ICD-10-CM

## 2018-09-22 ENCOUNTER — Encounter: Admission: RE | Payer: Self-pay | Source: Home / Self Care

## 2018-09-22 ENCOUNTER — Ambulatory Visit
Admission: RE | Admit: 2018-09-22 | Payer: BLUE CROSS/BLUE SHIELD | Source: Home / Self Care | Admitting: Gastroenterology

## 2018-09-22 SURGERY — COLONOSCOPY WITH PROPOFOL
Anesthesia: General

## 2018-09-28 ENCOUNTER — Encounter: Payer: Self-pay | Admitting: Anesthesiology

## 2018-09-29 ENCOUNTER — Ambulatory Visit: Payer: BLUE CROSS/BLUE SHIELD | Admitting: Anesthesiology

## 2018-09-29 ENCOUNTER — Ambulatory Visit
Admission: RE | Admit: 2018-09-29 | Discharge: 2018-09-29 | Disposition: A | Discharge status: Home / Self Care | Payer: BLUE CROSS/BLUE SHIELD | Attending: Gastroenterology | Admitting: Gastroenterology

## 2018-09-29 ENCOUNTER — Other Ambulatory Visit: Payer: Self-pay

## 2018-09-29 ENCOUNTER — Encounter
Admission: RE | Disposition: A | Discharge status: Home / Self Care | Payer: Self-pay | Source: Home / Self Care | Attending: Gastroenterology

## 2018-09-29 DIAGNOSIS — Z79899 Other long term (current) drug therapy: Secondary | ICD-10-CM | POA: Diagnosis not present

## 2018-09-29 DIAGNOSIS — K5730 Diverticulosis of large intestine without perforation or abscess without bleeding: Secondary | ICD-10-CM | POA: Diagnosis not present

## 2018-09-29 DIAGNOSIS — Z7951 Long term (current) use of inhaled steroids: Secondary | ICD-10-CM | POA: Diagnosis not present

## 2018-09-29 DIAGNOSIS — Z6839 Body mass index (BMI) 39.0-39.9, adult: Secondary | ICD-10-CM | POA: Insufficient documentation

## 2018-09-29 DIAGNOSIS — G4730 Sleep apnea, unspecified: Secondary | ICD-10-CM | POA: Diagnosis not present

## 2018-09-29 DIAGNOSIS — K228 Other specified diseases of esophagus: Secondary | ICD-10-CM | POA: Insufficient documentation

## 2018-09-29 DIAGNOSIS — D561 Beta thalassemia: Secondary | ICD-10-CM | POA: Diagnosis not present

## 2018-09-29 DIAGNOSIS — K319 Disease of stomach and duodenum, unspecified: Secondary | ICD-10-CM | POA: Insufficient documentation

## 2018-09-29 DIAGNOSIS — D509 Iron deficiency anemia, unspecified: Secondary | ICD-10-CM | POA: Diagnosis present

## 2018-09-29 DIAGNOSIS — L308 Other specified dermatitis: Secondary | ICD-10-CM | POA: Diagnosis not present

## 2018-09-29 DIAGNOSIS — R194 Change in bowel habit: Secondary | ICD-10-CM | POA: Diagnosis not present

## 2018-09-29 DIAGNOSIS — J439 Emphysema, unspecified: Secondary | ICD-10-CM | POA: Insufficient documentation

## 2018-09-29 DIAGNOSIS — J45909 Unspecified asthma, uncomplicated: Secondary | ICD-10-CM | POA: Insufficient documentation

## 2018-09-29 DIAGNOSIS — K635 Polyp of colon: Secondary | ICD-10-CM | POA: Insufficient documentation

## 2018-09-29 DIAGNOSIS — Z7982 Long term (current) use of aspirin: Secondary | ICD-10-CM | POA: Insufficient documentation

## 2018-09-29 HISTORY — PX: ESOPHAGOGASTRODUODENOSCOPY (EGD) WITH PROPOFOL: SHX5813

## 2018-09-29 HISTORY — PX: COLONOSCOPY WITH PROPOFOL: SHX5780

## 2018-09-29 SURGERY — ESOPHAGOGASTRODUODENOSCOPY (EGD) WITH PROPOFOL
Anesthesia: General

## 2018-09-29 MED ORDER — MIDAZOLAM HCL 2 MG/2ML IJ SOLN
INTRAMUSCULAR | Status: DC | PRN
  Administered 2018-09-29 (×2): 1 mg via INTRAVENOUS

## 2018-09-29 MED ORDER — FENTANYL CITRATE (PF) 100 MCG/2ML IJ SOLN
INTRAMUSCULAR | Status: AC
  Filled 2018-09-29: qty 2

## 2018-09-29 MED ORDER — PROPOFOL 500 MG/50ML IV EMUL
INTRAVENOUS | Status: DC | PRN
  Administered 2018-09-29: 18 ug/kg/min via INTRAVENOUS

## 2018-09-29 MED ORDER — BUTAMBEN-TETRACAINE-BENZOCAINE 2-2-14 % EX AERO
INHALATION_SPRAY | CUTANEOUS | Status: AC
  Filled 2018-09-29: qty 5

## 2018-09-29 MED ORDER — PROPOFOL 500 MG/50ML IV EMUL
INTRAVENOUS | Status: AC
  Filled 2018-09-29: qty 50

## 2018-09-29 MED ORDER — FENTANYL CITRATE (PF) 100 MCG/2ML IJ SOLN
INTRAMUSCULAR | Status: DC | PRN
  Administered 2018-09-29: 25 ug via INTRAVENOUS
  Administered 2018-09-29: 50 ug via INTRAVENOUS
  Administered 2018-09-29: 25 ug via INTRAVENOUS

## 2018-09-29 MED ORDER — SUGAMMADEX SODIUM 200 MG/2ML IV SOLN
INTRAVENOUS | Status: DC | PRN
  Administered 2018-09-29 (×2): 200 mg via INTRAVENOUS

## 2018-09-29 MED ORDER — ROCURONIUM BROMIDE 50 MG/5ML IV SOLN
INTRAVENOUS | Status: AC
  Filled 2018-09-29: qty 1

## 2018-09-29 MED ORDER — PROPOFOL 10 MG/ML IV BOLUS
INTRAVENOUS | Status: DC | PRN
  Administered 2018-09-29: 20 mg via INTRAVENOUS
  Administered 2018-09-29: 150 mg via INTRAVENOUS
  Administered 2018-09-29: 40 mg via INTRAVENOUS
  Administered 2018-09-29: 20 mg via INTRAVENOUS

## 2018-09-29 MED ORDER — ROCURONIUM BROMIDE 100 MG/10ML IV SOLN
INTRAVENOUS | Status: DC | PRN
  Administered 2018-09-29: 40 mg via INTRAVENOUS

## 2018-09-29 MED ORDER — ONDANSETRON HCL 4 MG/2ML IJ SOLN: 4.0000 mg | Freq: Once | INTRAMUSCULAR | Status: DC | PRN

## 2018-09-29 MED ORDER — PHENYLEPHRINE HCL (PRESSORS) 10 MG/ML IV SOLN
INTRAVENOUS | Status: DC | PRN
  Administered 2018-09-29 (×4): 100 ug via INTRAVENOUS

## 2018-09-29 MED ORDER — MIDAZOLAM HCL 2 MG/2ML IJ SOLN
INTRAMUSCULAR | Status: AC
  Filled 2018-09-29: qty 2

## 2018-09-29 MED ORDER — SEVOFLURANE IN SOLN
RESPIRATORY_TRACT | Status: AC
  Filled 2018-09-29: qty 250

## 2018-09-29 MED ORDER — SODIUM CHLORIDE 0.9 % IV SOLN
INTRAVENOUS | Status: DC
  Administered 2018-09-29: 1000 mL via INTRAVENOUS

## 2018-09-29 MED ORDER — LACTATED RINGERS IV SOLN
INTRAVENOUS | Status: DC | PRN
  Administered 2018-09-29: 08:00:00 via INTRAVENOUS

## 2018-09-29 MED ORDER — FENTANYL CITRATE (PF) 100 MCG/2ML IJ SOLN: 25.0000 ug | INTRAMUSCULAR | Status: DC | PRN

## 2018-09-29 MED ORDER — LIDOCAINE HCL (PF) 2 % IJ SOLN
INTRAMUSCULAR | Status: AC
  Filled 2018-09-29: qty 10

## 2018-09-29 MED ORDER — SUCCINYLCHOLINE CHLORIDE 20 MG/ML IJ SOLN
INTRAMUSCULAR | Status: DC | PRN
  Administered 2018-09-29: 100 mg via INTRAVENOUS

## 2018-09-29 MED ORDER — SUCCINYLCHOLINE CHLORIDE 20 MG/ML IJ SOLN
INTRAMUSCULAR | Status: AC
  Filled 2018-09-29: qty 1

## 2018-09-29 NOTE — Transfer of Care (Signed)
Immediate Anesthesia Transfer of Care Note  Patient: Evan Edwards  Procedure(s) Performed: ESOPHAGOGASTRODUODENOSCOPY (EGD) WITH PROPOFOL (N/A ) COLONOSCOPY WITH PROPOFOL (N/A )  Patient Location: PACU  Anesthesia Type:General  Level of Consciousness: awake, alert  and oriented  Airway & Oxygen Therapy: Patient Spontanous Breathing and Patient connected to face mask oxygen  Post-op Assessment: Report given to RN and Post -op Vital signs reviewed and stable  Post vital signs: Reviewed and stable  Last Vitals:  Vitals Value Taken Time  BP 183/93 09/29/2018  9:44 AM  Temp 36.7 C 09/29/2018  9:44 AM  Pulse 108 09/29/2018  9:48 AM  Resp 24 09/29/2018  9:48 AM  SpO2 95 % 09/29/2018  9:48 AM  Vitals shown include unvalidated device data.  Last Pain:  Vitals:   09/29/18 0649  TempSrc: Tympanic  PainSc: 0-No pain         Complications: No apparent anesthesia complications

## 2018-09-29 NOTE — Anesthesia Postprocedure Evaluation (Signed)
Anesthesia Post Note  Patient: Evan Edwards  Procedure(s) Performed: ESOPHAGOGASTRODUODENOSCOPY (EGD) WITH PROPOFOL (N/A ) COLONOSCOPY WITH PROPOFOL (N/A )  Patient location during evaluation: Endoscopy Anesthesia Type: General Level of consciousness: awake and alert Pain management: pain level controlled Vital Signs Assessment: post-procedure vital signs reviewed and stable Respiratory status: spontaneous breathing, nonlabored ventilation, respiratory function stable and patient connected to nasal cannula oxygen Cardiovascular status: blood pressure returned to baseline and stable Postop Assessment: no apparent nausea or vomiting Anesthetic complications: no     Last Vitals:  Vitals:   09/29/18 1015 09/29/18 1034  BP:    Pulse:  (!) 104  Resp:  18  Temp: 37 C 36.8 C  SpO2:  93%    Last Pain:  Vitals:   09/29/18 1034  TempSrc: Tympanic  PainSc: 0-No pain                 [name] S

## 2018-09-29 NOTE — Op Note (Signed)
[facility] Gastroenterology Patient Name: Evan Edwards Procedure Date: 09/29/2018 6:34 AM MRN: [id number] Account #: [id number] Date of Birth: 02/16/1971 Admit Type: Outpatient Age: [age] [facility] Gender: Male Note Status: Finalized Procedure:            Colonoscopy Indications:          Unexplained iron deficiency anemia, Change in bowel                        habits Providers:            Christena DeemMartin U. Skulskie, MD Medicines:            Monitored Anesthesia Care Complications:        No immediate complications. Procedure:            Pre-Anesthesia Assessment:                       - ASA Grade Assessment: IV - A patient with severe                        systemic disease that is a constant threat to life.                       After obtaining informed consent, the colonoscope was                        passed under direct vision. Throughout the procedure,                        the patient's blood pressure, pulse, and oxygen                        saturations were monitored continuously. The was                        introduced through the anus and advanced to the the                        cecum, identified by appendiceal orifice and ileocecal                        valve. The colonoscopy was performed without                        difficulty. The patient tolerated the procedure well.                        The quality of the bowel preparation was good. Findings:      Multiple small and large-mouthed diverticula were found in the sigmoid       colon and descending colon.      A 8 mm polyp was found in the distal transverse colon. The polyp was       sessile. The polyp was removed with a cold snare. Resection and       retrieval were complete.      The retroflexed view of the distal rectum and anal verge was normal and       showed no anal or rectal abnormalities.      The perianal exam findings include erosive dermatitis/moist  masceration. Impression:           -  Diverticulosis in the sigmoid colon and in the                        descending colon.                       - One 8 mm polyp in the distal transverse colon,                        removed with a cold snare. Resected and retrieved.                       - The distal rectum and anal verge are normal on                        retroflexion view.                       - Erosive dermatitis/moist masceration found on                        perianal exam. Recommendation:       - Await pathology results.                       - Apply desitin to peri-rectal area bid for 7 days. Can                        use prn afterwards.                       - Return to GI clinic in 3 weeks. Procedure Code(s):    --- Professional ---                       [phone], Colonoscopy, flexible; with removal of tumor(s),                        polyp(s), or other lesion(s) by snare technique Diagnosis Code(s):    --- Professional ---                       K63.5, Polyp of colon                       D50.9, Iron deficiency anemia, unspecified                       R19.4, Change in bowel habit                       K57.30, Diverticulosis of large intestine without                        perforation or abscess without bleeding CPT copyright 2019 American Medical Association. All rights reserved. The codes documented in this report are preliminary and upon coder review may  be revised to meet current compliance requirements. Christena Deem, MD 09/29/2018 9:32:03 AM This report has been signed electronically. Number of Addenda: 0 Note Initiated On: 09/29/2018 6:34 AM Scope Withdrawal Time: 0 hours 11 minutes 38 seconds  Total Procedure Duration: 0 hours 18 minutes 7 seconds       [facility]

## 2018-09-29 NOTE — Anesthesia Post-op Follow-up Note (Signed)
Anesthesia QCDR form completed.        

## 2018-09-29 NOTE — Progress Notes (Signed)
Patient discharged from endoscopy before I was able to discuss cases with him.  I have called his listed number and there was no answer.  I will try to call again in a bit.

## 2018-09-29 NOTE — Anesthesia Procedure Notes (Signed)
Date/Time: 09/29/2018 7:45 AM Performed by: Henrietta Hoover, CRNA Oxygen Delivery Method: Supernova nasal CPAP Placement Confirmation: positive ETCO2

## 2018-09-29 NOTE — Anesthesia Procedure Notes (Signed)
Procedure Name: Intubation Date/Time: 09/29/2018 8:18 AM Performed by: Henrietta Hoover, CRNA Pre-anesthesia Checklist: Patient identified, Patient being monitored, Timeout performed, Emergency Drugs available and Suction available Patient Re-evaluated:Patient Re-evaluated prior to induction Oxygen Delivery Method: Circle system utilized Preoxygenation: Pre-oxygenation with 100% oxygen Induction Type: IV induction Ventilation: Mask ventilation without difficulty Laryngoscope Size: 3 and McGraph Grade View: Grade I Tube type: Oral Tube size: 7.5 mm Number of attempts: 1 Airway Equipment and Method: Stylet Placement Confirmation: ETT inserted through vocal cords under direct vision,  positive ETCO2 and breath sounds checked- equal and bilateral Secured at: 21 cm Tube secured with: Tape Dental Injury: Teeth and Oropharynx as per pre-operative assessment

## 2018-09-29 NOTE — Op Note (Addendum)
[facility] Gastroenterology Patient Name: Evan Edwards Procedure Date: 09/29/2018 6:35 AM MRN: [id number] Account #: [id number] Date of Birth: 05/06/1971 Admit Type: Outpatient Age: 48 [facility] Gender: Male Note Status: Finalized Procedure:            Upper GI endoscopy Indications:          Iron deficiency anemia, Suspected upper                        gastrointestinal bleeding in patient with unexplained                        iron deficiency anemia Providers:            Christena DeemMartin U. Jessicamarie Amiri, MD Referring MD:         Gracelyn NurseJohn D. Johnston, MD (Referring MD) Medicines:            Monitored Anesthesia Care Complications:        No immediate complications. Procedure:            Pre-Anesthesia Assessment:                       - ASA Grade Assessment: IV - A patient with severe                        systemic disease that is a constant threat to life.                       After obtaining informed consent, the endoscope was                        passed under direct vision. Throughout the procedure,                        the patient's blood pressure, pulse, and oxygen                        saturations were monitored continuously. The procedure                        was aborted. The scope was not inserted. Medications                        were given. The upper GI endoscopy was extremely                        difficult due to the patient's agitation and the                        patient's combativeness and oxygen desaturation.                        Successful completion of the procedure was aided by                        administering oxygen. The Endoscope was introduced                        through the mouth, and advanced to the. Findings:      an attempt was made to introduce  the scope, however I was unable to       intubate the esophagus due to marked oxygen desaturation and       agitation/combativness. procedure aborted.      Decision to  intubate for procedure made with anesthesia.      The Z-line was irregular and was found 28 cm from the incisors. No       evidence of esophageal varices.      Patchy minimal inflammation characterized by congestion (edema) and       erythema was found in the gastric antrum. Biopsies were taken with a       cold forceps for histology from the antrum and body of the stomach.       Biopsies were taken with a cold forceps for Helicobacter pylori testing.      The exam of the stomach was otherwise normal.      The cardia and gastric fundus were normal on retroflexion.      The examined duodenum was normal.      There were esophageal mucosal changes consistent with long-segment       Barrett's esophagus present in the lower third of the esophagus. The       maximum longitudinal extent of these mucosal changes was 12 cm in       length. Mucosa was biopsied with a cold forceps for histology in 4       quadrants and randomly at intervals of 2 cm at 27/28, 30, 32, 34, 36, 38       and 40 cm from the incisors, these placed into separate jars.. Impression:           - The findings are suspicious for Barrett's esophagus. Recommendation:       - Await pathology results.                       - Use Protonix (pantoprazole) 40 mg PO BID daily.                       - Return to GI clinic in 3 weeks. Diagnosis Code(s):    --- Professional ---                       D50.9, Iron deficiency anemia, unspecified Christena Deem, MD 09/29/2018 8:19:33 AM This report has been signed electronically. Number of Addenda: 0 Note Initiated On: 09/29/2018 6:35 AM      [facility]

## 2018-09-29 NOTE — Anesthesia Procedure Notes (Signed)
Date/Time: 09/29/2018 8:00 AM Performed by: Henrietta Hoover, CRNA Pre-anesthesia Checklist: Patient identified, Emergency Drugs available, Suction available, Patient being monitored and Timeout performed Nasal Tubes: Nasal Rae Number of attempts: 1 Placement Confirmation: positive ETCO2

## 2018-09-29 NOTE — H&P (Signed)
Outpatient short stay form Pre-procedure 09/29/2018 7:45 AM Christena Deem MD  Primary Physician: Marcelino Duster, MD  Reason for visit: EGD and colonoscopy  History of present illness: Patient is a 48 year old male presenting today for EGD and colonoscopy.  He has been noticing a change in bowel habits with maroon-colored stools at times.  He does have some occasional left lower quadrant discomfort and some lower abdominal discomfort.  He did have a umbilical hernia repair on 01/05/2018 and states that many of the symptoms started afterwards.  Is also complaining of a weight gain.  He does drink 2 large beers daily has been taking some NSAIDs.  He is currently taking Protonix.  His platelet count on 09/25/2018 was 208.  Protonix has been working well for his reflux symptoms.  Does have a complaint of some thick mucus at times there is no dysphagia.  He denies any heartburn while taking his medications.  He does not take any aspirin product except an 81 mg aspirin or blood thinning agent.  Patient does have a history of beta thalassemia    Current Facility-Administered Medications:  .  0.9 %  sodium chloride infusion, , Intravenous, Continuous, Christena Deem, MD, Last Rate: 20 mL/hr at 09/29/18 0703, 1,000 mL at 09/29/18 0703  Medications Prior to Admission  Medication Sig Dispense Refill Last Dose  . aspirin 81 MG chewable tablet Chew 1 tablet (81 mg total) by mouth daily.   Past Week at Unknown time  . cetirizine (ZYRTEC) 10 MG tablet Take 10 mg by mouth every morning.   5 Past Week at Unknown time  . furosemide (LASIX) 20 MG tablet Take 1 tablet (20 mg total) by mouth 2 (two) times daily. 60 tablet 11 Past Week at Unknown time  . loratadine (CLARITIN) 10 MG tablet Take 10 mg by mouth every evening.   Past Week at Unknown time  . NAPROXEN DR 500 MG EC tablet Take 1 tablet by mouth 2 (two) times daily as needed.  0 Past Week at Unknown time  . PROAIR HFA 108 (90 Base) MCG/ACT inhaler Inhale 2  puffs into the lungs every 6 (six) hours as needed. For wheezing.  5 Past Week at Unknown time  . SYMBICORT 160-4.5 MCG/ACT inhaler Inhale 2 puffs into the lungs 2 (two) times daily.  11 Past Week at Unknown time  . terbinafine (LAMISIL) 250 MG tablet Take 250 mg by mouth daily.  2 Past Week at Unknown time     No Known Allergies   Past Medical History:  Diagnosis Date  . Anemia   . Asthma   . Bacterial pneumonia 2014  . Beta thalassemia (HCC)   . Emphysema lung (HCC)   . GERD (gastroesophageal reflux disease)   . Sleep apnea    uses cpap    Review of systems:      Physical Exam    Heart and lungs: Rhythm without rub or gallop is are clear    HEENT: Normocephalic atraumatic eyes are anicteric    Other:    Pertinant exam for procedure: Protuberant firm to palpation but not distended.  Bowel sounds are positive    Planned proceedures: EGD, colonoscopy and indicated procedures. I have discussed the risks benefits and complications of procedures to include not limited to bleeding, infection, perforation and the risk of sedation and the patient wishes to proceed.    Christena Deem, MD Gastroenterology 09/29/2018  7:45 AM

## 2018-09-29 NOTE — Anesthesia Preprocedure Evaluation (Signed)
Anesthesia Evaluation  Patient identified by MRN, date of birth, ID band Patient awake    Reviewed: Allergy & Precautions, NPO status , Patient's Chart, lab work & pertinent test results, reviewed documented beta blocker date and time   Airway Mallampati: III  TM Distance: >3 FB     Dental  (+) Chipped   Pulmonary asthma , sleep apnea and Continuous Positive Airway Pressure Ventilation , pneumonia, resolved, COPD,           Cardiovascular      Neuro/Psych    GI/Hepatic GERD  ,  Endo/Other  Morbid obesity  Renal/GU      Musculoskeletal   Abdominal   Peds  Hematology  (+) anemia ,   Anesthesia Other Findings EKG ok 1 yr ago. EF 60-65.  Reproductive/Obstetrics                             Anesthesia Physical Anesthesia Plan  ASA: III  Anesthesia Plan: General   Post-op Pain Management:    Induction: Intravenous  PONV Risk Score and Plan:   Airway Management Planned:   Additional Equipment:   Intra-op Plan:   Post-operative Plan:   Informed Consent: I have reviewed the patients History and Physical, chart, labs and discussed the procedure including the risks, benefits and alternatives for the proposed anesthesia with the patient or authorized representative who has indicated his/her understanding and acceptance.       Plan Discussed with: CRNA  Anesthesia Plan Comments:         Anesthesia Quick Evaluation

## 2018-10-02 LAB — SURGICAL PATHOLOGY

## 2018-10-03 ENCOUNTER — Encounter: Payer: Self-pay | Admitting: Gastroenterology

## 2019-03-05 ENCOUNTER — Ambulatory Visit: Admission: RE | Admit: 2019-03-05 | Payer: BC Managed Care – PPO | Source: Ambulatory Visit

## 2019-03-20 ENCOUNTER — Other Ambulatory Visit: Payer: Self-pay | Admitting: Specialist

## 2019-03-20 DIAGNOSIS — J849 Interstitial pulmonary disease, unspecified: Secondary | ICD-10-CM

## 2019-04-17 ENCOUNTER — Inpatient Hospital Stay
Admission: EM | Admit: 2019-04-17 | Discharge: 2019-04-19 | DRG: 370 | Disposition: A | Discharge status: Home / Self Care | Payer: BC Managed Care – PPO | Attending: Family Medicine | Admitting: Family Medicine

## 2019-04-17 ENCOUNTER — Encounter: Payer: Self-pay | Admitting: Emergency Medicine

## 2019-04-17 ENCOUNTER — Other Ambulatory Visit: Payer: Self-pay

## 2019-04-17 DIAGNOSIS — Z8719 Personal history of other diseases of the digestive system: Secondary | ICD-10-CM

## 2019-04-17 DIAGNOSIS — K226 Gastro-esophageal laceration-hemorrhage syndrome: Secondary | ICD-10-CM | POA: Diagnosis not present

## 2019-04-17 DIAGNOSIS — I10 Essential (primary) hypertension: Secondary | ICD-10-CM | POA: Diagnosis present

## 2019-04-17 DIAGNOSIS — Z7982 Long term (current) use of aspirin: Secondary | ICD-10-CM

## 2019-04-17 DIAGNOSIS — F1010 Alcohol abuse, uncomplicated: Secondary | ICD-10-CM | POA: Diagnosis present

## 2019-04-17 DIAGNOSIS — K920 Hematemesis: Secondary | ICD-10-CM | POA: Diagnosis present

## 2019-04-17 DIAGNOSIS — Z20828 Contact with and (suspected) exposure to other viral communicable diseases: Secondary | ICD-10-CM | POA: Diagnosis present

## 2019-04-17 DIAGNOSIS — R002 Palpitations: Secondary | ICD-10-CM | POA: Diagnosis present

## 2019-04-17 DIAGNOSIS — Z7951 Long term (current) use of inhaled steroids: Secondary | ICD-10-CM

## 2019-04-17 DIAGNOSIS — E1165 Type 2 diabetes mellitus with hyperglycemia: Secondary | ICD-10-CM

## 2019-04-17 DIAGNOSIS — D72829 Elevated white blood cell count, unspecified: Secondary | ICD-10-CM | POA: Diagnosis present

## 2019-04-17 DIAGNOSIS — G4730 Sleep apnea, unspecified: Secondary | ICD-10-CM | POA: Diagnosis present

## 2019-04-17 DIAGNOSIS — J45909 Unspecified asthma, uncomplicated: Secondary | ICD-10-CM | POA: Diagnosis present

## 2019-04-17 DIAGNOSIS — G4733 Obstructive sleep apnea (adult) (pediatric): Secondary | ICD-10-CM | POA: Diagnosis present

## 2019-04-17 DIAGNOSIS — J439 Emphysema, unspecified: Secondary | ICD-10-CM | POA: Diagnosis present

## 2019-04-17 DIAGNOSIS — Z79899 Other long term (current) drug therapy: Secondary | ICD-10-CM

## 2019-04-17 DIAGNOSIS — Z87891 Personal history of nicotine dependence: Secondary | ICD-10-CM

## 2019-04-17 DIAGNOSIS — K922 Gastrointestinal hemorrhage, unspecified: Secondary | ICD-10-CM

## 2019-04-17 DIAGNOSIS — K2270 Barrett's esophagus without dysplasia: Secondary | ICD-10-CM | POA: Diagnosis present

## 2019-04-17 DIAGNOSIS — K219 Gastro-esophageal reflux disease without esophagitis: Secondary | ICD-10-CM | POA: Diagnosis present

## 2019-04-17 DIAGNOSIS — D561 Beta thalassemia: Secondary | ICD-10-CM | POA: Diagnosis present

## 2019-04-17 DIAGNOSIS — K449 Diaphragmatic hernia without obstruction or gangrene: Secondary | ICD-10-CM | POA: Diagnosis present

## 2019-04-17 LAB — CBC
HCT: 48.2 % (ref 39.0–52.0)
HCT: 49.1 % (ref 39.0–52.0)
Hemoglobin: 13.5 g/dL (ref 13.0–17.0)
Hemoglobin: 13.2 g/dL (ref 13.0–17.0)
MCH: 18.1 pg — ABNORMAL LOW (ref 26.0–34.0)
MCH: 18 pg — ABNORMAL LOW (ref 26.0–34.0)
MCHC: 28 g/dL — ABNORMAL LOW (ref 30.0–36.0)
MCHC: 26.9 g/dL — ABNORMAL LOW (ref 30.0–36.0)
MCV: 64.8 fL — ABNORMAL LOW (ref 80.0–100.0)
MCV: 67 fL — ABNORMAL LOW (ref 80.0–100.0)
Platelets: 246 10*3/uL (ref 150–400)
Platelets: 231 10*3/uL (ref 150–400)
RBC: 7.44 MIL/uL — ABNORMAL HIGH (ref 4.22–5.81)
RBC: 7.33 MIL/uL — ABNORMAL HIGH (ref 4.22–5.81)
RDW: 22.3 % — ABNORMAL HIGH (ref 11.5–15.5)
RDW: 22 % — ABNORMAL HIGH (ref 11.5–15.5)
WBC: 14 10*3/uL — ABNORMAL HIGH (ref 4.0–10.5)
WBC: 10.9 10*3/uL — ABNORMAL HIGH (ref 4.0–10.5)
nRBC: 0.1 % (ref 0.0–0.2)
nRBC: 0 % (ref 0.0–0.2)

## 2019-04-17 LAB — COMPREHENSIVE METABOLIC PANEL
ALT: 26 U/L (ref 0–44)
AST: 25 U/L (ref 15–41)
Albumin: 3.4 g/dL — ABNORMAL LOW (ref 3.5–5.0)
Alkaline Phosphatase: 72 U/L (ref 38–126)
Anion gap: 12 (ref 5–15)
BUN: 7 mg/dL (ref 6–20)
CO2: 26 mmol/L (ref 22–32)
Calcium: 9.5 mg/dL (ref 8.9–10.3)
Chloride: 97 mmol/L — ABNORMAL LOW (ref 98–111)
Creatinine, Ser: 0.83 mg/dL (ref 0.61–1.24)
GFR calc Af Amer: >60 mL/min (ref >60)
GFR calc non Af Amer: >60 mL/min (ref >60)
Glucose, Bld: 139 mg/dL — ABNORMAL HIGH (ref 70–99)
Potassium: 3.5 mmol/L (ref 3.5–5.1)
Sodium: 135 mmol/L (ref 135–145)
Total Bilirubin: 0.7 mg/dL (ref 0.3–1.2)
Total Protein: 7.5 g/dL (ref 6.5–8.1)

## 2019-04-17 LAB — TYPE AND SCREEN
ABO/RH(D): O POS
Antibody Screen: NEGATIVE

## 2019-04-17 LAB — GLUCOSE, CAPILLARY: Glucose-Capillary: 115 mg/dL — ABNORMAL HIGH (ref 70–99)

## 2019-04-17 LAB — PROTIME-INR
INR: 1.1 (ref 0.8–1.2)
Prothrombin Time: 13.7 seconds (ref 11.4–15.2)

## 2019-04-17 MED ORDER — ONDANSETRON HCL 4 MG/2ML IJ SOLN: 4.0000 mg | Freq: Four times a day (QID) | INTRAMUSCULAR | Status: DC | PRN

## 2019-04-17 MED ORDER — ONDANSETRON HCL 4 MG PO TABS
4.0000 mg | ORAL_TABLET | Freq: Four times a day (QID) | ORAL | Status: DC | PRN
  Filled 2019-04-17: qty 1

## 2019-04-17 MED ORDER — ACETAMINOPHEN 650 MG RE SUPP: 650.0000 mg | Freq: Four times a day (QID) | RECTAL | Status: DC | PRN

## 2019-04-17 MED ORDER — VITAMIN B-1 100 MG PO TABS
100.0000 mg | ORAL_TABLET | Freq: Every day | ORAL | Status: DC
  Administered 2019-04-17 – 2019-04-18 (×2): 100 mg via ORAL
  Filled 2019-04-17 (×3): qty 1

## 2019-04-17 MED ORDER — PANTOPRAZOLE SODIUM 40 MG IV SOLR: 40.0000 mg | Freq: Two times a day (BID) | INTRAVENOUS | Status: DC

## 2019-04-17 MED ORDER — METOPROLOL SUCCINATE ER 25 MG PO TB24
25.0000 mg | ORAL_TABLET | Freq: Every day | ORAL | Status: DC
  Administered 2019-04-18: 25 mg via ORAL
  Filled 2019-04-17 (×2): qty 1

## 2019-04-17 MED ORDER — SODIUM CHLORIDE 0.9 % IV SOLN
8.0000 mg/h | INTRAVENOUS | Status: DC
  Administered 2019-04-17 – 2019-04-18 (×2): 8 mg/h via INTRAVENOUS
  Filled 2019-04-17 (×3): qty 80

## 2019-04-17 MED ORDER — LORAZEPAM 2 MG/ML IJ SOLN: 1.0000 mg | INTRAMUSCULAR | Status: DC | PRN

## 2019-04-17 MED ORDER — SODIUM CHLORIDE 0.9 % IV SOLN
80.0000 mg | Freq: Once | INTRAVENOUS | Status: AC
  Administered 2019-04-17: 80 mg via INTRAVENOUS
  Filled 2019-04-17: qty 80

## 2019-04-17 MED ORDER — THIAMINE HCL 100 MG/ML IJ SOLN
100.0000 mg | Freq: Every day | INTRAMUSCULAR | Status: DC
  Filled 2019-04-17: qty 1

## 2019-04-17 MED ORDER — FOLIC ACID 1 MG PO TABS
1.0000 mg | ORAL_TABLET | Freq: Every day | ORAL | Status: DC
  Administered 2019-04-17 – 2019-04-18 (×2): 1 mg via ORAL
  Filled 2019-04-17 (×3): qty 1

## 2019-04-17 MED ORDER — ADULT MULTIVITAMIN W/MINERALS CH
1.0000 | ORAL_TABLET | Freq: Every day | ORAL | Status: DC
  Administered 2019-04-17 – 2019-04-18 (×2): 1 via ORAL
  Filled 2019-04-17 (×3): qty 1

## 2019-04-17 MED ORDER — LORAZEPAM 2 MG/ML IJ SOLN: 0.0000 mg | Freq: Four times a day (QID) | INTRAMUSCULAR | Status: DC

## 2019-04-17 MED ORDER — LORAZEPAM 1 MG PO TABS: 1.0000 mg | ORAL_TABLET | ORAL | Status: DC | PRN

## 2019-04-17 MED ORDER — LORAZEPAM 2 MG/ML IJ SOLN: 0.0000 mg | Freq: Two times a day (BID) | INTRAMUSCULAR | Status: DC

## 2019-04-17 MED ORDER — INSULIN ASPART 100 UNIT/ML ~~LOC~~ SOLN: 0.0000 [IU] | SUBCUTANEOUS | Status: DC

## 2019-04-17 MED ORDER — ACETAMINOPHEN 325 MG PO TABS: 650.0000 mg | ORAL_TABLET | Freq: Four times a day (QID) | ORAL | Status: DC | PRN

## 2019-04-17 NOTE — ED Provider Notes (Signed)
[facility] Emergency Department Provider Note   ____________________________________________   I have reviewed the triage vital signs and the nursing notes.   HISTORY  Chief Complaint Hematemesis   History limited by: Not Limited   HPI Evan Edwards is a 48 y.o. male who presents to the emergency department today because of concerns for GI bleed.  The patient states that for the last week he has been having dark black stool as well as black vomiting.  He does typically vomit every morning.  He has seen GI for this without any obvious etiology.  For the past week his vomit is black.  Typically it is a yellow color.  He has not had any significant abdominal pain with this.  Patient does drink roughly 15 beers a day.  He has had some intermittent shortness of breath during this past week.  Has been taking his antacid. Denies any fevers.  Records reviewed. Per medical record review patient has a history of GERD.   Past Medical History:  Diagnosis Date  . Anemia   . Asthma   . Bacterial pneumonia 2014  . Beta thalassemia ([place])   . Emphysema lung ([place])   . GERD (gastroesophageal reflux disease)   . Sleep apnea    uses cpap    Patient Active Problem List   Diagnosis Date Noted  . Pneumonitis 02/21/2018    Past Surgical History:  Procedure Laterality Date  . APPENDECTOMY    . Bacterial Pneumonia Surgery     Pt unable to state what exactly was done, states "they went inside of removed it".  . COLONOSCOPY WITH PROPOFOL N/A 09/29/2018   Procedure: COLONOSCOPY WITH PROPOFOL;  Surgeon: Lollie Sails, MD;  Location: [facility] ENDOSCOPY;  Service: Endoscopy;  Laterality: N/A;  . ESOPHAGOGASTRODUODENOSCOPY (EGD) WITH PROPOFOL N/A 09/29/2018   Procedure: ESOPHAGOGASTRODUODENOSCOPY (EGD) WITH PROPOFOL;  Surgeon: Lollie Sails, MD;  Location: [facility] ENDOSCOPY;  Service: Endoscopy;  Laterality: N/A;  . UMBILICAL HERNIA REPAIR N/A 01/05/2018   Procedure: HERNIA  REPAIR UMBILICAL ADULT WITH MESH;  Surgeon: Benjamine Sprague, DO;  Location: [facility] ORS;  Service: General;  Laterality: N/A;  . UPPER GI ENDOSCOPY      Prior to Admission medications   Medication Sig Start Date End Date Taking? Authorizing Provider  aspirin 81 MG chewable tablet Chew 1 tablet (81 mg total) by mouth daily. 02/24/18   Dustin Flock, MD  cetirizine (ZYRTEC) 10 MG tablet Take 10 mg by mouth every morning.  12/12/17   [provider]  furosemide (LASIX) 20 MG tablet Take 1 tablet (20 mg total) by mouth 2 (two) times daily. 02/23/18 02/23/19  Dustin Flock, MD  loratadine (CLARITIN) 10 MG tablet Take 10 mg by mouth every evening.    [provider]  NAPROXEN DR 500 MG EC tablet Take 1 tablet by mouth 2 (two) times daily as needed. 02/08/18   [provider]  PROAIR HFA 108 (90 Base) MCG/ACT inhaler Inhale 2 puffs into the lungs every 6 (six) hours as needed. For wheezing. 04/25/15   [provider]  SYMBICORT 160-4.5 MCG/ACT inhaler Inhale 2 puffs into the lungs 2 (two) times daily. 07/05/15   [provider]  terbinafine (LAMISIL) 250 MG tablet Take 250 mg by mouth daily. 12/07/17   [provider]    Allergies Patient has no known allergies.  No family history on file.  Social History Social History   Tobacco Use  . Smoking status: Never Smoker  .  Smokeless tobacco: Former Engineer, water Use Topics  . Alcohol use: Yes    Alcohol/week: 4.0 standard drinks    Types: 4 Cans of beer per week  . Drug use: No    Review of Systems Constitutional: No fever/chills Eyes: No visual changes. ENT: No sore throat. Cardiovascular: Denies chest pain. Respiratory: Positive for shortness of breath. Gastrointestinal: Positive for black tarry stool. Positive for black emesis.  Genitourinary: Negative for dysuria. Musculoskeletal: Negative for back pain. Skin: Negative for rash. Neurological: Negative for headaches, focal weakness or  numbness.  ____________________________________________   PHYSICAL EXAM:  VITAL SIGNS: ED Triage Vitals  Enc Vitals Group     BP 04/17/19 1531 (!) 153/90     Pulse Rate 04/17/19 1531 70     Resp 04/17/19 1531 20     Temp 04/17/19 1531 98.2 F (36.8 C)     Temp Source 04/17/19 1531 Oral     SpO2 04/17/19 1531 98 %     Weight 04/17/19 1528 240 lb (108.9 kg)     Height 04/17/19 1528 5\' 8"  (1.727 m)     Head Circumference --      Peak Flow --      Pain Score 04/17/19 1528 0   Constitutional: Alert and oriented.  Eyes: Conjunctivae are normal.  ENT      Head: Normocephalic and atraumatic.      Nose: No congestion/rhinnorhea.      Mouth/Throat: Mucous membranes are moist.      Neck: No stridor. Hematological/Lymphatic/Immunilogical: No cervical lymphadenopathy. Cardiovascular: Normal rate, regular rhythm.  No murmurs, rubs, or gallops.  Respiratory: Normal respiratory effort without tachypnea nor retractions. Breath sounds are clear and equal bilaterally. No wheezes/rales/rhonchi. Gastrointestinal: Soft and mildly tender to palpation diffusely. Genitourinary: Deferred Musculoskeletal: Normal range of motion in all extremities. No lower extremity edema. Neurologic:  Normal speech and language. No gross focal neurologic deficits are appreciated.  Skin:  Skin is warm, dry and intact. No rash noted. Psychiatric: Mood and affect are normal. Speech and behavior are normal. Patient exhibits appropriate insight and judgment.  ____________________________________________    LABS (pertinent positives/negatives)  CBC wbc 14.0, hgb 13.5, plt 246 CMP wnl except cl 97, glu 139, alb 3.4  ____________________________________________   EKG   None  ____________________________________________    RADIOLOGY  None  ____________________________________________   PROCEDURES  Procedures  ____________________________________________   INITIAL IMPRESSION / ASSESSMENT AND PLAN  / ED COURSE  Pertinent labs & imaging results that were available during my care of the patient were reviewed by me and considered in my medical decision making (see chart for details).   Patient presented to the emergency department today because of concerns for GI bleed.  Patient does describe black stool as well as black emesis.  Per chart review he did contact his GI doctor who recommended inpatient management.  At this point I think that is completely reasonable.  We will start Protonix.  Will plan on admission.  Discussed plan with patient.  ____________________________________________   FINAL CLINICAL IMPRESSION(S) / ED DIAGNOSES  Final diagnoses:  Gastrointestinal hemorrhage, unspecified gastrointestinal hemorrhage type     Note: This dictation was prepared with Dragon dictation. Any transcriptional errors that result from this process are unintentional     04/19/19, MD 04/17/19 1912

## 2019-04-17 NOTE — ED Triage Notes (Signed)
Pt reports has been vomiting dark black blood for a week. Pt denies pain but reports some SOB at times.

## 2019-04-17 NOTE — H&P (Signed)
History and Physical    [name] KNL:[id number] DOB: 1970/08/22 DOA: 04/17/2019  PCP: [name], MD  Patient coming from: Home.  Chief Complaint: Hematemesis.  HPI: Evan Edwards is a 48 y.o. male with history of diabetes mellitus type 2, palpitations, beta thalassemia, GERD, sleep apnea and alcohol abuse was referred to the ER by patient's gastroenterology with concerns of GI bleed.  Patient states over the last 1 week he has been throwing up dark-colored fluids and denies frank bleeding.  Also has noticed some black stools for the last 1 week.  Has had some left lower quadrant abdominal discomfort off and on for last 1 week.  Presently abdominal pain-free.  Denies fever chills.  Given the symptoms patient had gone to his gastroenterologist who advised to come to the ER.  ED Course: In the ER patient is hemodynamically stable.  COVID-19 test is pending.  Hemoglobin 13.5 WBC 14 creatinine 1.8 LFTs normal except for elevated 3.4.  INR is pending.  On May 2020 patient had EGD and colonoscopy.  Colonoscopy showed diverticulosis.  EGD showed Barrett's esophagus.  On exam patient's abdomen appears benign.  Review of Systems: As per HPI, rest all negative.   Past Medical History:  Diagnosis Date  . Anemia   . Asthma   . Bacterial pneumonia 2014  . Beta thalassemia ([place])   . Emphysema lung ([place])   . GERD (gastroesophageal reflux disease)   . Sleep apnea    uses cpap    Past Surgical History:  Procedure Laterality Date  . APPENDECTOMY    . Bacterial Pneumonia Surgery     Pt unable to state what exactly was done, states "they went inside of removed it".  . COLONOSCOPY WITH PROPOFOL N/A 09/29/2018   Procedure: COLONOSCOPY WITH PROPOFOL;  Surgeon: Lollie Sails, MD;  Location: [facility] ENDOSCOPY;  Service: Endoscopy;  Laterality: N/A;  . ESOPHAGOGASTRODUODENOSCOPY (EGD) WITH PROPOFOL N/A 09/29/2018   Procedure: ESOPHAGOGASTRODUODENOSCOPY (EGD) WITH PROPOFOL;  Surgeon:  Lollie Sails, MD;  Location: [facility] ENDOSCOPY;  Service: Endoscopy;  Laterality: N/A;  . UMBILICAL HERNIA REPAIR N/A 01/05/2018   Procedure: HERNIA REPAIR UMBILICAL ADULT WITH MESH;  Surgeon: Benjamine Sprague, DO;  Location: [facility] ORS;  Service: General;  Laterality: N/A;  . UPPER GI ENDOSCOPY       reports that he has never smoked. He has quit using smokeless tobacco. He reports current alcohol use of about 4.0 standard drinks of alcohol per week. He reports that he does not use drugs.  No Known Allergies  Family History  Family history unknown: Yes    Prior to Admission medications   Medication Sig Start Date End Date Taking? Authorizing Provider  bumetanide (BUMEX) 1 MG tablet Take 1 mg by mouth 2 (two) times daily. 03/29/19  Yes [provider]  cetirizine (ZYRTEC) 10 MG tablet Take 10 mg by mouth every morning.  12/12/17  Yes [provider]  ferrous sulfate 325 (65 FE) MG EC tablet Take 325 mg by mouth daily. 01/13/19  Yes [provider]  loratadine (CLARITIN) 10 MG tablet Take 10 mg by mouth every evening.   Yes [provider]  metFORMIN (GLUCOPHAGE) 1000 MG tablet Take 1,000 mg by mouth 2 (two) times daily. 04/10/19  Yes [provider]  metoprolol succinate (TOPROL-XL) 25 MG 24 hr tablet Take 25 mg by mouth daily. 04/10/19  Yes [provider]  pantoprazole (PROTONIX) 40 MG tablet Take 40 mg by mouth daily. 10/31/18  Yes  [provider]  testosterone cypionate (DEPOTESTOSTERONE CYPIONATE) 200 MG/ML injection Inject 200 mg into the muscle every 14 (fourteen) days. 04/11/19  Yes [provider]    Physical Exam: Constitutional: Moderately built and nourished. Vitals:   04/17/19 1528 04/17/19 1531 04/17/19 1932 04/17/19 2000  BP:  (!) 153/90 (!) 165/96 (!) 147/99  Pulse:  70 79 84  Resp:  20 18 16   Temp:  98.2 F (36.8 C)    TempSrc:  Oral    SpO2:  98% 96% 93%  Weight: 108.9 kg     Height: 5\' 8"  (1.727 m)       Eyes: Anicteric no pallor. ENMT: No discharge from the ears eyes nose or mouth. Neck: No mass felt.  No neck rigidity. Respiratory: No rhonchi or crepitations. Cardiovascular: S1-S2 heard. Abdomen: Soft nontender bowel sounds present. Musculoskeletal: No edema. Skin: No rash. Neurologic: Alert awake oriented to time place and person.  Moves all extremities. Psychiatric: Appears normal per normal affect.   Labs on Admission: I have personally reviewed following labs and imaging studies  CBC: Recent Labs  Lab 04/17/19 1532  WBC 14.0*  HGB 13.5  HCT 48.2  MCV 64.8*  PLT 246   Basic Metabolic Panel: Recent Labs  Lab 04/17/19 1532  NA 135  K 3.5  CL 97*  CO2 26  GLUCOSE 139*  BUN 7  CREATININE 0.83  CALCIUM 9.5   GFR: Estimated Creatinine Clearance: 128.8 mL/min (by C-G formula based on SCr of 0.83 mg/dL). Liver Function Tests: Recent Labs  Lab 04/17/19 1532  AST 25  ALT 26  ALKPHOS 72  BILITOT 0.7  PROT 7.5  ALBUMIN 3.4*   No results for input(s): LIPASE, AMYLASE in the last 168 hours. No results for input(s): AMMONIA in the last 168 hours. Coagulation Profile: No results for input(s): INR, PROTIME in the last 168 hours. Cardiac Enzymes: No results for input(s): CKTOTAL, CKMB, CKMBINDEX, TROPONINI in the last 168 hours. BNP (last 3 results) No results for input(s): PROBNP in the last 8760 hours. HbA1C: No results for input(s): HGBA1C in the last 72 hours. CBG: No results for input(s): GLUCAP in the last 168 hours. Lipid Profile: No results for input(s): CHOL, HDL, LDLCALC, TRIG, CHOLHDL, LDLDIRECT in the last 72 hours. Thyroid Function Tests: No results for input(s): TSH, T4TOTAL, FREET4, T3FREE, THYROIDAB in the last 72 hours. Anemia Panel: No results for input(s): VITAMINB12, FOLATE, FERRITIN, TIBC, IRON, RETICCTPCT in the last 72 hours. Urine analysis:    Component Value Date/Time   COLORURINE YELLOW (A) 02/22/2018 0654   APPEARANCEUR CLEAR  (A) 02/22/2018 0654   LABSPEC 1.032 (H) 02/22/2018 0654   PHURINE 5.0 02/22/2018 0654   GLUCOSEU NEGATIVE 02/22/2018 0654   HGBUR NEGATIVE 02/22/2018 0654   BILIRUBINUR NEGATIVE 02/22/2018 0654   KETONESUR NEGATIVE 02/22/2018 0654   PROTEINUR NEGATIVE 02/22/2018 0654   NITRITE NEGATIVE 02/22/2018 0654   LEUKOCYTESUR NEGATIVE 02/22/2018 0654   Sepsis Labs: @LABRCNTIP (procalcitonin:4,lacticidven:4) )No results found for this or any previous visit (from the past 240 hour(s)).   Radiological Exams on Admission: No results found.   Assessment/Plan Principal Problem:   Hematemesis Active Problems:   Alcohol abuse   Controlled type 2 diabetes mellitus with hyperglycemia (HCC)    1. Hematemesis -we will keep patient on Protonix infusion n.p.o. check serial CBCs-type and screen transfuse if hemoglobin less than 7 or patient becomes hypotensive.  Consult patient's gastroenterologist. 2. Diabetes mellitus type 2 we will keep patient on sliding scale coverage. 3.  Patient drinks alcohol every day we will keep patient on CIWA protocol.  Social work consult. 4. History of palpitations/hypertension on Toprol. 5. Patient uses diuretics for fluid overload.  Exact cause not known.  Will hold it for now until we make sure that patient is not having any CVA GI bleed. 6. Leukocytosis likely reactionary.  No signs of infection at this time.  Closely observe.  COVID-19 test is pending.   DVT prophylaxis: SCDs. Code Status: Full code. Family Communication: Discussed with patient. Disposition Plan: Home. Consults called: Sent message to patient's gastroenterologist. Admission status: Observation.   Eduard Clos MD [facility] Pager [phone].  If 7PM-7AM, please contact night-coverage [email or web address] Password [email or web address]  04/17/2019, 9:11 PM

## 2019-04-17 NOTE — ED Notes (Signed)
EDP at bedside  

## 2019-04-18 ENCOUNTER — Encounter: Payer: Self-pay | Admitting: *Deleted

## 2019-04-18 ENCOUNTER — Observation Stay: Payer: BC Managed Care – PPO | Admitting: Anesthesiology

## 2019-04-18 ENCOUNTER — Encounter
Admission: EM | Disposition: A | Discharge status: Home / Self Care | Payer: Self-pay | Source: Home / Self Care | Attending: Family Medicine

## 2019-04-18 DIAGNOSIS — Z79899 Other long term (current) drug therapy: Secondary | ICD-10-CM | POA: Diagnosis not present

## 2019-04-18 DIAGNOSIS — I10 Essential (primary) hypertension: Secondary | ICD-10-CM | POA: Diagnosis present

## 2019-04-18 DIAGNOSIS — Z8719 Personal history of other diseases of the digestive system: Secondary | ICD-10-CM | POA: Diagnosis not present

## 2019-04-18 DIAGNOSIS — K226 Gastro-esophageal laceration-hemorrhage syndrome: Secondary | ICD-10-CM | POA: Diagnosis present

## 2019-04-18 DIAGNOSIS — G4730 Sleep apnea, unspecified: Secondary | ICD-10-CM | POA: Diagnosis present

## 2019-04-18 DIAGNOSIS — D561 Beta thalassemia: Secondary | ICD-10-CM | POA: Diagnosis present

## 2019-04-18 DIAGNOSIS — K449 Diaphragmatic hernia without obstruction or gangrene: Secondary | ICD-10-CM | POA: Diagnosis present

## 2019-04-18 DIAGNOSIS — F1010 Alcohol abuse, uncomplicated: Secondary | ICD-10-CM | POA: Diagnosis present

## 2019-04-18 DIAGNOSIS — G4733 Obstructive sleep apnea (adult) (pediatric): Secondary | ICD-10-CM | POA: Diagnosis present

## 2019-04-18 DIAGNOSIS — K922 Gastrointestinal hemorrhage, unspecified: Secondary | ICD-10-CM | POA: Diagnosis present

## 2019-04-18 DIAGNOSIS — E1165 Type 2 diabetes mellitus with hyperglycemia: Secondary | ICD-10-CM | POA: Diagnosis present

## 2019-04-18 DIAGNOSIS — D72829 Elevated white blood cell count, unspecified: Secondary | ICD-10-CM | POA: Diagnosis present

## 2019-04-18 DIAGNOSIS — K920 Hematemesis: Secondary | ICD-10-CM | POA: Diagnosis not present

## 2019-04-18 DIAGNOSIS — J439 Emphysema, unspecified: Secondary | ICD-10-CM | POA: Diagnosis present

## 2019-04-18 DIAGNOSIS — Z87891 Personal history of nicotine dependence: Secondary | ICD-10-CM | POA: Diagnosis not present

## 2019-04-18 DIAGNOSIS — Z7951 Long term (current) use of inhaled steroids: Secondary | ICD-10-CM | POA: Diagnosis not present

## 2019-04-18 DIAGNOSIS — Z20828 Contact with and (suspected) exposure to other viral communicable diseases: Secondary | ICD-10-CM | POA: Diagnosis present

## 2019-04-18 DIAGNOSIS — J45909 Unspecified asthma, uncomplicated: Secondary | ICD-10-CM | POA: Diagnosis present

## 2019-04-18 DIAGNOSIS — Z7982 Long term (current) use of aspirin: Secondary | ICD-10-CM | POA: Diagnosis not present

## 2019-04-18 DIAGNOSIS — R002 Palpitations: Secondary | ICD-10-CM | POA: Diagnosis present

## 2019-04-18 DIAGNOSIS — K2270 Barrett's esophagus without dysplasia: Secondary | ICD-10-CM | POA: Diagnosis present

## 2019-04-18 DIAGNOSIS — K219 Gastro-esophageal reflux disease without esophagitis: Secondary | ICD-10-CM | POA: Diagnosis present

## 2019-04-18 DIAGNOSIS — D62 Acute posthemorrhagic anemia: Secondary | ICD-10-CM | POA: Diagnosis not present

## 2019-04-18 HISTORY — PX: ESOPHAGOGASTRODUODENOSCOPY: SHX5428

## 2019-04-18 LAB — BASIC METABOLIC PANEL
Anion gap: 13 (ref 5–15)
BUN: 7 mg/dL (ref 6–20)
CO2: 29 mmol/L (ref 22–32)
Calcium: 8.7 mg/dL — ABNORMAL LOW (ref 8.9–10.3)
Chloride: 95 mmol/L — ABNORMAL LOW (ref 98–111)
Creatinine, Ser: 0.66 mg/dL (ref 0.61–1.24)
GFR calc Af Amer: >60 mL/min (ref >60)
GFR calc non Af Amer: >60 mL/min (ref >60)
Glucose, Bld: 123 mg/dL — ABNORMAL HIGH (ref 70–99)
Potassium: 3.2 mmol/L — ABNORMAL LOW (ref 3.5–5.1)
Sodium: 137 mmol/L (ref 135–145)

## 2019-04-18 LAB — GLUCOSE, CAPILLARY
Glucose-Capillary: 105 mg/dL — ABNORMAL HIGH (ref 70–99)
Glucose-Capillary: 115 mg/dL — ABNORMAL HIGH (ref 70–99)
Glucose-Capillary: 119 mg/dL — ABNORMAL HIGH (ref 70–99)
Glucose-Capillary: 93 mg/dL (ref 70–99)
Glucose-Capillary: 93 mg/dL (ref 70–99)
Glucose-Capillary: 147 mg/dL — ABNORMAL HIGH (ref 70–99)

## 2019-04-18 LAB — CBC
HCT: 48.7 % (ref 39.0–52.0)
Hemoglobin: 12.9 g/dL — ABNORMAL LOW (ref 13.0–17.0)
MCH: 17.8 pg — ABNORMAL LOW (ref 26.0–34.0)
MCHC: 26.5 g/dL — ABNORMAL LOW (ref 30.0–36.0)
MCV: 67.1 fL — ABNORMAL LOW (ref 80.0–100.0)
Platelets: 212 10*3/uL (ref 150–400)
RBC: 7.26 MIL/uL — ABNORMAL HIGH (ref 4.22–5.81)
RDW: 21.8 % — ABNORMAL HIGH (ref 11.5–15.5)
WBC: 7.6 10*3/uL (ref 4.0–10.5)
nRBC: 0 % (ref 0.0–0.2)

## 2019-04-18 LAB — HEMOGLOBIN A1C
Hgb A1c MFr Bld: 6.4 % — ABNORMAL HIGH (ref 4.8–5.6)
Mean Plasma Glucose: 136.98 mg/dL

## 2019-04-18 LAB — SARS CORONAVIRUS 2 (TAT 6-24 HRS): SARS Coronavirus 2: NEGATIVE

## 2019-04-18 LAB — HIV ANTIBODY (ROUTINE TESTING W REFLEX): HIV Screen 4th Generation wRfx: NONREACTIVE

## 2019-04-18 SURGERY — EGD (ESOPHAGOGASTRODUODENOSCOPY)
Anesthesia: General

## 2019-04-18 MED ORDER — LIDOCAINE HCL (PF) 2 % IJ SOLN
INTRAMUSCULAR | Status: DC | PRN
  Administered 2019-04-18: 60 mg via INTRADERMAL

## 2019-04-18 MED ORDER — PROPOFOL 500 MG/50ML IV EMUL
INTRAVENOUS | Status: DC | PRN
  Administered 2019-04-18: 175 ug/kg/min via INTRAVENOUS

## 2019-04-18 MED ORDER — PANTOPRAZOLE SODIUM 40 MG PO TBEC
40.0000 mg | DELAYED_RELEASE_TABLET | Freq: Two times a day (BID) | ORAL | Status: DC
  Administered 2019-04-18: 40 mg via ORAL
  Filled 2019-04-18 (×2): qty 1

## 2019-04-18 MED ORDER — PROPOFOL 10 MG/ML IV BOLUS
INTRAVENOUS | Status: DC | PRN
  Administered 2019-04-18: 100 mg via INTRAVENOUS

## 2019-04-18 MED ORDER — PROPOFOL 500 MG/50ML IV EMUL
INTRAVENOUS | Status: AC
  Filled 2019-04-18: qty 50

## 2019-04-18 MED ORDER — SODIUM CHLORIDE 0.9 % IV SOLN
INTRAVENOUS | Status: DC
  Administered 2019-04-18: 16:00:00 via INTRAVENOUS

## 2019-04-18 NOTE — Transfer of Care (Signed)
Immediate Anesthesia Transfer of Care Note  Patient: Evan Edwards  Procedure(s) Performed: ESOPHAGOGASTRODUODENOSCOPY (EGD) (N/A )  Patient Location: Endoscopy Unit  Anesthesia Type:General  Level of Consciousness: drowsy and patient cooperative  Airway & Oxygen Therapy: Patient Spontanous Breathing and Patient connected to nasal cannula oxygen  Post-op Assessment: Report given to RN and Post -op Vital signs reviewed and stable  Post vital signs: Reviewed and stable  Last Vitals:  Vitals Value Taken Time  BP 140/81 04/18/19 1644  Temp 36.6 C 04/18/19 1641  Pulse 98 04/18/19 1645  Resp 22 04/18/19 1645  SpO2 94 % 04/18/19 1645  Vitals shown include unvalidated device data.  Last Pain:  Vitals:   04/18/19 1641  TempSrc: Temporal  PainSc: 0-No pain         Complications: No apparent anesthesia complications

## 2019-04-18 NOTE — Progress Notes (Signed)
PROGRESS NOTE    Evan Edwards  HYQ:[id number] DOB: [birth year] DOA: 04/17/2019 PCP: Marcelino Duster, MD      Brief Narrative:  Evan Edwards is a 48 y.o. M with DM, beta thalassemia, OSA, and alcohol use disorder who presented from GI clinic with concerns of GI bleed.  Had had 1 week throwing up dark-colored fluids as well as black stools and colicky left lower quadrant abdominal discomfort.  In the ER hemoglobin 13.5, transaminases normal.  Creatinine normal.  Started on Protonix,  GI consulted.      Assessment & Plan:  Hematemesis, possible melena -Continue Protonix -Consult GI, EGD pending   Diabetes Glucoses well controlled -Sliding scale corrections ordered -Hold metformin  Alcohol use disorder -CIWA protocol -Thiamine and folate  History of palpitations Hypertension Blood pressure controlled -Continue metoprolol -Hold diuretic/Bumex  Leukocytosis Likely reactionary, no suspicion for infection       MDM and disposition: The below labs and imaging reports were reviewed and summarized above.  Medication management as above.  The patient was admitted with hematemesis.  WIll undergo EGD.        DVT prophylaxis: SCDs Code Status: FULL Family Communication:     Consultants:   Gastroenterology  Procedures:   11/18 EGD pending  Antimicrobials:       Subjective: Feeling well.  No nausea, vomiting, headache, chest pain.  No fever.  Objective: Vitals:   04/18/19 0116 04/18/19 0315 04/18/19 0532 04/18/19 0845  BP: 135/84  (!) 137/93 (!) 139/102  Pulse: 79 60 82 88  Resp: 18  16 12   Temp:      TempSrc:      SpO2:   98% 97%  Weight:      Height:        Intake/Output Summary (Last 24 hours) at 04/18/2019 0919 Last data filed at 04/18/2019 0841 Gross per 24 hour  Intake 325.43 ml  Output -  Net 325.43 ml   Filed Weights   04/17/19 1528  Weight: 108.9 kg    Examination: General appearance:  adult male, alert and in  no acute distress.   HEENT: Anicteric, conjunctiva pink, lids and lashes normal. No nasal deformity, discharge, epistaxis.  Lips moist.   Skin: Warm and dry.  No jaundice.  No suspicious rashes or lesions. Cardiac: RRR, nl S1-S2, no murmurs appreciated.  Capillary refill is brisk.  JVP not visible.  No LE edema.  Radial  pulses 2+ and symmetric. Respiratory: Normal respiratory rate and rhythm.  CTAB without rales or wheezes. Abdomen: Abdomen soft.  No TTP or guarding. No ascites, distension, hepatosplenomegaly.   MSK: No deformities or effusions. Neuro: Awake and alert.  EOMI, moves all extremities. Speech fluent.    Psych: Sensorium intact and responding to questions, attention normal. Affect normal.  Judgment and insight appear normal.    Data Reviewed: I have personally reviewed following labs and imaging studies:  CBC: Recent Labs  Lab 04/17/19 1532 04/17/19 2140 04/18/19 0531  WBC 14.0* 10.9* 7.6  HGB 13.5 13.2 12.9*  HCT 48.2 49.1 48.7  MCV 64.8* 67.0* 67.1*  PLT 246 231 212   Basic Metabolic Panel: Recent Labs  Lab 04/17/19 1532 04/18/19 0531  NA 135 137  K 3.5 3.2*  CL 97* 95*  CO2 26 29  GLUCOSE 139* 123*  BUN 7 7  CREATININE 0.83 0.66  CALCIUM 9.5 8.7*   GFR: Estimated Creatinine Clearance: 133.7 mL/min (by C-G formula based on SCr of 0.66 mg/dL). Liver Function Tests: Recent Labs  Lab 04/17/19 1532  AST 25  ALT 26  ALKPHOS 72  BILITOT 0.7  PROT 7.5  ALBUMIN 3.4*   No results for input(s): LIPASE, AMYLASE in the last 168 hours. No results for input(s): AMMONIA in the last 168 hours. Coagulation Profile: Recent Labs  Lab 04/17/19 2140  INR 1.1   Cardiac Enzymes: No results for input(s): CKTOTAL, CKMB, CKMBINDEX, TROPONINI in the last 168 hours. BNP (last 3 results) No results for input(s): PROBNP in the last 8760 hours. HbA1C: Recent Labs    04/17/19 2140  HGBA1C 6.4*   CBG: Recent Labs  Lab 04/17/19 2314 04/18/19 0114 04/18/19 0526  04/18/19 0906  GLUCAP 115* 105* 115* 119*   Lipid Profile: No results for input(s): CHOL, HDL, LDLCALC, TRIG, CHOLHDL, LDLDIRECT in the last 72 hours. Thyroid Function Tests: No results for input(s): TSH, T4TOTAL, FREET4, T3FREE, THYROIDAB in the last 72 hours. Anemia Panel: No results for input(s): VITAMINB12, FOLATE, FERRITIN, TIBC, IRON, RETICCTPCT in the last 72 hours. Urine analysis:    Component Value Date/Time   COLORURINE YELLOW (A) 02/22/2018 0654   APPEARANCEUR CLEAR (A) 02/22/2018 0654   LABSPEC 1.032 (H) 02/22/2018 0654   PHURINE 5.0 02/22/2018 0654   GLUCOSEU NEGATIVE 02/22/2018 0654   HGBUR NEGATIVE 02/22/2018 0654   BILIRUBINUR NEGATIVE 02/22/2018 0654   KETONESUR NEGATIVE 02/22/2018 0654   PROTEINUR NEGATIVE 02/22/2018 0654   NITRITE NEGATIVE 02/22/2018 0654   LEUKOCYTESUR NEGATIVE 02/22/2018 0654   Sepsis Labs: @LABRCNTIP (procalcitonin:4,lacticacidven:4)  ) Recent Results (from the past 240 hour(s))  SARS CORONAVIRUS 2 (TAT 6-24 HRS) Nasopharyngeal Nasopharyngeal Swab     Status: None   Collection Time: 04/17/19  7:29 PM   Specimen: Nasopharyngeal Swab  Result Value Ref Range Status   SARS Coronavirus 2 NEGATIVE NEGATIVE Final    Comment: (NOTE) SARS-CoV-2 target nucleic acids are NOT DETECTED. The SARS-CoV-2 RNA is generally detectable in upper and lower respiratory specimens during the acute phase of infection. Negative results do not preclude SARS-CoV-2 infection, do not rule out co-infections with other pathogens, and should not be used as the sole basis for treatment or other patient management decisions. Negative results must be combined with clinical observations, patient history, and epidemiological information. The expected result is Negative. Fact Sheet for Patients: HairSlick.nohttps://www.fda.gov/media/138098/download Fact Sheet for Healthcare Providers: quierodirigir.comhttps://www.fda.gov/media/138095/download This test is not yet approved or cleared by the [place] FDA and  has been authorized for detection and/or diagnosis of SARS-CoV-2 by FDA under an Emergency Use Authorization (EUA). This EUA will remain  in effect (meaning this test can be used) for the duration of the COVID-19 declaration under Section 56 4(b)(1) of the Act, 21 U.S.C. section 360bbb-3(b)(1), unless the authorization is terminated or revoked sooner. Performed at [facility], [address] [place], [place] [address]          Radiology Studies: No results found.      Scheduled Meds: . folic acid  1 mg Oral Daily  . insulin aspart  0-9 Units Subcutaneous Q4H  . LORazepam  0-4 mg Intravenous Q6H   Followed by  . [START ON 04/19/2019] LORazepam  0-4 mg Intravenous Q12H  . metoprolol succinate  25 mg Oral Daily  . multivitamin with minerals  1 tablet Oral Daily  . [START ON 04/21/2019] pantoprazole  40 mg Intravenous Q12H  . thiamine  100 mg Oral Daily   Or  . thiamine  100 mg Intravenous Daily   Continuous Infusions: . pantoprozole (PROTONIX) infusion 8  mg/hr (04/18/19 0841)     LOS: 0 days    Time spent: 25 minutes    Edwin Dada, MD [facility] 04/18/2019, 9:19 AM     Please page though New Era or Epic secure chat:  For [employer], Adult nurse

## 2019-04-18 NOTE — H&P (View-Only) (Signed)
GI Inpatient Consult Note  Reason for Consult: Melena, Coffee-ground emesis    Attending Requesting Consult: Dr. Maryfrances Bunnell  History of Present Illness: Evan Edwards is a 48 y.o. male seen for evaluation of melena and coffee-ground emesis at the request of Dr. Maryfrances Bunnell. Pt has a PMH of T2DM, beta thalassemia, GERD, sleep apnea. He is established with [facility] and called in to our office yesterday for complaints of coffee-ground emesis and melena over the past week. He was advised to go immediately to the ED for further evaluation. He reports that over the past week he has been experiencing 1-2 episodes of coffee-ground emesis. He denies any frank hematemesis. He reports appx 3 episodes of black, tarry stools over the past week as well. He reports this has never occurred before. He reports that over the past two years he has been having several episodes of non-bloody and non-bilious emesis of unknown origin. He had EGD and colonoscopy performed 09/2018 by Dr. Marva Panda which showed Barrett's esophagus wo dysplasia. Colonoscopy showed diverticulosis. He denies any nausea, vomiting, dysphagia, odynophagia, early satiety, or abdominal pain. He reports he has not been using NSAIDs over the past several months, but reports a history of taking ibuprofen or aleve several months ago for chronic pains. He reports he does not take oral iron or use Pepto-Bismol. He denies any bright red bloody stools. He denies any lower abdominal cramping, rectal pain. No known family history of esophageal, gastric, pancreatic, or colon malignancies. He reports on an average weekend he can drink a 24-pack of beer and does drink 3-4 beers a night during the week. Upon presentation to the ED, he was found to have hemoglobin 13.5, WBC 14. He reports one bowel movement this morning that was a dark brown color. No emesis today. He denies fever, chills, night sweats. No sick contacts or recent travel.    Last Colonoscopy: 09/2018 -  Diverticulosis in the sigmoid colon and in the descending colon. - One 8 mm polyp in the distal transverse colon, removed with a cold snare. Resected and retrieved. - The distal rectum and anal verge are normal on retroflexion view. - Erosive dermatitis/moist masceration found on perianal exam. Last Endoscopy: 09/2018 -Barrett's esophagus wo dysplasia -Gastritis    Past Medical History:  Past Medical History:  Diagnosis Date  . Anemia   . Asthma   . Bacterial pneumonia 2014  . Beta thalassemia (HCC)   . Emphysema lung (HCC)   . GERD (gastroesophageal reflux disease)   . Sleep apnea    uses cpap    Problem List: Patient Active Problem List   Diagnosis Date Noted  . Hematemesis 04/17/2019  . Alcohol abuse 04/17/2019  . Controlled type 2 diabetes mellitus with hyperglycemia (HCC) 04/17/2019  . Pneumonitis 02/21/2018    Past Surgical History: Past Surgical History:  Procedure Laterality Date  . APPENDECTOMY    . Bacterial Pneumonia Surgery     Pt unable to state what exactly was done, states "they went inside of removed it".  . COLONOSCOPY WITH PROPOFOL N/A 09/29/2018   Procedure: COLONOSCOPY WITH PROPOFOL;  Surgeon: Christena Deem, MD;  Location: [facility] ENDOSCOPY;  Service: Endoscopy;  Laterality: N/A;  . ESOPHAGOGASTRODUODENOSCOPY (EGD) WITH PROPOFOL N/A 09/29/2018   Procedure: ESOPHAGOGASTRODUODENOSCOPY (EGD) WITH PROPOFOL;  Surgeon: Christena Deem, MD;  Location: [facility] ENDOSCOPY;  Service: Endoscopy;  Laterality: N/A;  . UMBILICAL HERNIA REPAIR N/A 01/05/2018   Procedure: HERNIA REPAIR UMBILICAL ADULT WITH MESH;  Surgeon: Sung Amabile, DO;  Location:  [facility] ORS;  Service: General;  Laterality: N/A;  . UPPER GI ENDOSCOPY      Allergies: No Known Allergies  Home Medications: (Not in a hospital admission)  Home medication reconciliation was completed with the patient.   Scheduled Inpatient Medications:   . folic acid  1 mg Oral Daily  . insulin aspart  0-9 Units  Subcutaneous Q4H  . LORazepam  0-4 mg Intravenous Q6H   Followed by  . [START ON 04/19/2019] LORazepam  0-4 mg Intravenous Q12H  . metoprolol succinate  25 mg Oral Daily  . multivitamin with minerals  1 tablet Oral Daily  . [START ON 04/21/2019] pantoprazole  40 mg Intravenous Q12H  . thiamine  100 mg Oral Daily   Or  . thiamine  100 mg Intravenous Daily    Continuous Inpatient Infusions:   . pantoprozole (PROTONIX) infusion 8 mg/hr (04/18/19 1223)    PRN Inpatient Medications:  acetaminophen **OR** acetaminophen, LORazepam **OR** LORazepam, ondansetron **OR** ondansetron (ZOFRAN) IV  Family History: Family history is unknown by patient.  The patient's family history is negative for inflammatory bowel disorders, GI malignancy, or solid organ transplantation.  Social History:   reports that he has never smoked. He has quit using smokeless tobacco. He reports current alcohol use of about 4.0 standard drinks of alcohol per week. He reports that he does not use drugs. The patient denies ETOH, tobacco, or drug use.   Review of Systems: Constitutional: Weight is stable.  Eyes: No changes in vision. ENT: No oral lesions, sore throat.  GI: see HPI.  Heme/Lymph: No easy bruising.  CV: No chest pain.  GU: No hematuria.  Integumentary: No rashes.  Neuro: No headaches.  Psych: No depression/anxiety.  Endocrine: No heat/cold intolerance.  Allergic/Immunologic: No urticaria.  Resp: No cough, SOB.  Musculoskeletal: No joint swelling.    Physical Examination: BP 134/89 (BP Location: Right Arm)   Pulse 75   Temp 98.2 F (36.8 C) (Oral)   Resp 16   Ht 5\' 8"  (1.727 m)   Wt 108.9 kg   SpO2 95%   BMI 36.49 kg/m  Gen: NAD, alert and oriented x 4 HEENT: PEERLA, EOMI, Neck: supple, no JVD or thyromegaly Chest: CTA bilaterally, no wheezes, crackles, or other adventitious sounds CV: RRR, no m/g/c/r Abd: soft, NT, ND, +BS in all four quadrants; no HSM, guarding, ridigity, or rebound  tenderness Ext: no edema, well perfused with 2+ pulses, Skin: no rash or lesions noted Lymph: no LAD  Data: Lab Results  Component Value Date   WBC 7.6 04/18/2019   HGB 12.9 (L) 04/18/2019   HCT 48.7 04/18/2019   MCV 67.1 (L) 04/18/2019   PLT 212 04/18/2019   Recent Labs  Lab 04/17/19 1532 04/17/19 2140 04/18/19 0531  HGB 13.5 13.2 12.9*   Lab Results  Component Value Date   NA 137 04/18/2019   K 3.2 (L) 04/18/2019   CL 95 (L) 04/18/2019   CO2 29 04/18/2019   BUN 7 04/18/2019   CREATININE 0.66 04/18/2019   Lab Results  Component Value Date   ALT 26 04/17/2019   AST 25 04/17/2019   ALKPHOS 72 04/17/2019   BILITOT 0.7 04/17/2019   Recent Labs  Lab 04/17/19 2140  INR 1.1   EGD/Colon Path 09/2018: DIAGNOSIS:  A. STOMACH, ANTRUM; COLD BIOPSY:  - MILD REACTIVE GASTROPATHY.  - NEGATIVE FOR ACTIVE INFLAMMATION, H. PYLORI, INTESTINAL METAPLASIA, DYSPLASIA, AND MALIGNANCY.   B. STOMACH, BODY; COLD BIOPSY:  - OXYNTIC MUCOSA WITH FEATURES  SUGGESTIVE OF PROTON PUMP INHIBITOR EFFECT.  - NEGATIVE FOR H. PYLORI, INTESTINAL METAPLASIA, DYSPLASIA, AND MALIGNANCY.   C. ESOPHAGUS AT 40 CM; COLD BIOPSY:  - SQUAMOCOLUMNAR MUCOSA WITH INTESTINAL METAPLASIA, CONSISTENT WITH BARRETT'S ESOPHAGUS.  - NEGATIVE FOR DYSPLASIA AND MALIGNANCY.   D. ESOPHAGUS AT 38 CM; COLD BIOPSY:  - COLUMNAR-LINED MUCOSA WITH INTESTINAL METAPLASIA, CONSISTENT WITH BARRETT'S ESOPHAGUS.  - DETACHED FRAGMENT OF SQUAMOUS EPITHELIUM.  - NEGATIVE FOR DYSPLASIA AND MALIGNANCY.   E. ESOPHAGUS AT 36 CM; COLD BIOPSY:  - COLUMNAR-LINED MUCOSA WITH INTESTINAL METAPLASIA, CONSISTENT WITH BARRETT'S ESOPHAGUS.  - NEGATIVE FOR DYSPLASIA AND MALIGNANCY.   F. ESOPHAGUS AT 34 CM; COLD BIOPSY:  - COLUMNAR-LINED MUCOSA WITH INTESTINAL METAPLASIA, CONSISTENT WITH BARRETT'S ESOPHAGUS.  - NEGATIVE FOR DYSPLASIA AND MALIGNANCY.   G. ESOPHAGUS AT 32 CM; COLD BIOPSY:  - COLUMNAR-LINED MUCOSA WITH INTESTINAL  METAPLASIA, CONSISTENT WITH  BARRETT'S ESOPHAGUS.  - NEGATIVE FOR DYSPLASIA AND MALIGNANCY.   H. ESOPHAGUS AT 30 CM; COLD BIOPSY:  - COLUMNAR-LINED MUCOSA WITH INTESTINAL METAPLASIA, CONSISTENT WITH BARRETT'S ESOPHAGUS.  - NEGATIVE FOR DYSPLASIA AND MALIGNANCY.   I. ESOPHAGUS AT 28 CM; COLD BIOPSY:  - SQUAMOCOLUMNAR MUCOSA WITH INTESTINAL METAPLASIA, CONSISTENT WITH BARRETT'S ESOPHAGUS.  - NEGATIVE FOR DYSPLASIA AND MALIGNANCY.   J. COLON POLYP, DISTAL TRANSVERSE; COLD SNARE:  - POLYPOID MUCOSA WITH REACTIVE CRYPT HYPERPLASIA, MARKED CHRONIC INFLAMMATION, AND SURFACE EROSION, CONSISTENT WITH INFLAMMATORY POLYP.  - NEGATIVE FOR DYSPLASIA AND MALIGNANCY.   Assessment/Plan:  48 y/o Caucasian male with a PMH of sleep apnea, beta thalassemia, T2DM, GERD presented to ED for coffee-ground emesis and melena x1 week  1. Coffee-ground emesis 2. Melena 3. Alcohol abuse - CIWA protocol 4. GERD 5. Barrett's esophagus wo dysplasia  -Differential includes Mallory-Weiss tear, esophagitis, gastritis +/- H pylori, AVMs, duodenitis, peptic ulcer disease  -Hemodynamically stable with hemoglobin >13. Continue to monitor H&H. Transfuse for Hgb <7.0. -Agree with Protonix acid suppression. He needs to be on long-term therapy given his newly diagnosed Barrett's esophagus -Advise EGD for luminal evaluation for diagnostic and potentially therapeutic reasons. We discussed procedure details and indications today. -Remain NPO -COVID status - NEGATIVE -Plan for EGD this afternoon with Dr. Toledo -See procedure note for findings and further recommendations  I reviewed the risks (including bleeding, perforation, infection, anesthesia complications, cardiac/respiratory complications), benefits and alternatives of EGD. Patient consents to proceed.    Thank you for the consult. Please call with questions or concerns.  Granville M Croley, PA-C [facility] [phone]  (Cell)  

## 2019-04-18 NOTE — Anesthesia Postprocedure Evaluation (Signed)
Anesthesia Post Note  Patient: Evan Edwards  Procedure(s) Performed: ESOPHAGOGASTRODUODENOSCOPY (EGD) (N/A )  Patient location during evaluation: Endoscopy Anesthesia Type: General Level of consciousness: awake and alert Pain management: pain level controlled Vital Signs Assessment: post-procedure vital signs reviewed and stable Respiratory status: spontaneous breathing and respiratory function stable Cardiovascular status: stable Anesthetic complications: no     Last Vitals:  Vitals:   04/18/19 1641 04/18/19 1644  BP:  140/81  Pulse:    Resp:  (!) 22  Temp: 36.6 C   SpO2:  95%    Last Pain:  Vitals:   04/18/19 1641  TempSrc: Temporal  PainSc: 0-No pain                 Haani Bakula K

## 2019-04-18 NOTE — Interval H&P Note (Signed)
History and Physical Interval Note:  04/18/2019 4:24 PM  Evan Edwards  has presented today for surgery, with the diagnosis of Coffee-ground emesis, melena.  The various methods of treatment have been discussed with the patient and family. After consideration of risks, benefits and other options for treatment, the patient has consented to  Procedure(s): ESOPHAGOGASTRODUODENOSCOPY (EGD) (N/A) as a surgical intervention.  The patient's history has been reviewed, patient examined, no change in status, stable for surgery.  I have reviewed the patient's chart and labs.  Questions were answered to the patient's satisfaction.     [place], [place]

## 2019-04-18 NOTE — ED Notes (Signed)
PT reports to having black stool and emesis for the past week, pt reports he typically drinks 15 beers per day, last drank a 6 pack Monday night.  Pt denies any w/d sxs currently.  Will continue to monitor.

## 2019-04-18 NOTE — OR Nursing (Signed)
Report to primary rn. JACK. PT ALERT. VERY TALKATIVE. NO COMPLAINTS. TRANSFERRED BACK TO FLOOR

## 2019-04-18 NOTE — ED Notes (Signed)
Admitting MD at bedside at this time to speak with patient.  

## 2019-04-18 NOTE — Anesthesia Post-op Follow-up Note (Signed)
Anesthesia QCDR form completed.        

## 2019-04-18 NOTE — Op Note (Signed)
[facility]astroenterology Patient Name: Evan Edwards Procedure Date: 04/18/2019 4:28 PM MRN: [id number] Account #: [id number] Date of Birth: 10/14/1970 Admit Type: Outpatient Age: [age] Room: [facility] ENDO ROOM 3 Gender: Male Note Status: Finalized Procedure:             Upper GI endoscopy Indications:           Hematemesis Providers:             Boykin Nearingeodoro K. Christyan Reger MD, MD Medicines:             Propofol per Anesthesia Complications:         No immediate complications. Procedure:             Pre-Anesthesia Assessment:                        - The risks and benefits of the procedure and the                         sedation options and risks were discussed with the                         patient. All questions were answered and informed                         consent was obtained.                        - Patient identification and proposed procedure were                         verified prior to the procedure by the nurse. The                         procedure was verified in the procedure room.                        - ASA Grade Assessment: III - A patient with severe                         systemic disease.                        - After reviewing the risks and benefits, the patient                         was deemed in satisfactory condition to undergo the                         procedure.                        After obtaining informed consent, the endoscope was                         passed under direct vision. Throughout the procedure,                         the patient's blood pressure, pulse, and oxygen  saturations were monitored continuously. The Endoscope                         was introduced through the mouth, and advanced to the                         third part of duodenum. The upper GI endoscopy was                         accomplished without difficulty. The patient tolerated                         the procedure  well. Findings:      A 7 mm non-bleeding Mallory-Weiss tear with no stigmata of recent       bleeding was found.      A 4 cm hiatal hernia was present. Estimated blood loss: none.      The examined duodenum was normal.      The exam was otherwise without abnormality. Impression:            - Mallory-Weiss tear.                        - 4 cm hiatal hernia.                        - Normal examined duodenum.                        - The examination was otherwise normal.                        - No specimens collected. Recommendation:        - Return patient to hospital ward for ongoing care.                        - Clear liquid diet.                        - Advance diet as tolerated.                        - Use Protonix (pantoprazole) 40 mg PO BID.                        - See inpatient chart and orders for further                         recommendations                        - Call us back if we can help - GI sign off. Procedure Code(s):     --- Professional ---                        [id number], Esophagogastroduodenoscopy, flexible,                         transoral; diagnostic, including collection of  specimen(s) by brushing or washing, when performed                         (separate procedure) Diagnosis Code(s):     --- Professional ---                        K92.0, Hematemesis                        K44.9, Diaphragmatic hernia without obstruction or                         gangrene                        K22.6, Gastro-esophageal laceration-hemorrhage syndrome CPT copyright 2019 American Medical Association. All rights reserved. The codes documented in this report are preliminary and upon coder review may  be revised to meet current compliance requirements. [name], MD 04/18/2019 4:40:56 PM This report has been signed electronically. Number of Addenda: 0 Note Initiated On: 04/18/2019 4:28 PM Estimated Blood Loss:  Estimated blood loss: none.       [facility]

## 2019-04-18 NOTE — ED Notes (Addendum)
This RN to bedside at this time, NAD noted, pt awakens easily to verbal stimuli. VS obtained by this RN. This RN apologized and explained delay for this RN coming to bedside, pt states understanding at this time. Will continue to monitor for further patient needs.

## 2019-04-18 NOTE — Anesthesia Preprocedure Evaluation (Signed)
Anesthesia Evaluation  Patient identified by MRN, date of birth, ID band Patient awake    Reviewed: Allergy & Precautions, NPO status , Patient's Chart, lab work & pertinent test results  History of Anesthesia Complications Negative for: history of anesthetic complications  Airway Mallampati: III       Dental   Pulmonary asthma , sleep apnea (not using CPAP at this time) and Continuous Positive Airway Pressure Ventilation , COPD,  COPD inhaler, Not current smoker,           Cardiovascular (-) hypertension(-) Past MI, (-) CHF and (-) Orthopnea (-) dysrhythmias (-) Valvular Problems/Murmurs     Neuro/Psych neg Seizures    GI/Hepatic Neg liver ROS, GERD  Medicated and Controlled,  Endo/Other  diabetes, Type 2, Oral Hypoglycemic Agents  Renal/GU      Musculoskeletal   Abdominal   Peds  Hematology  (+) anemia ,   Anesthesia Other Findings   Reproductive/Obstetrics                             Anesthesia Physical Anesthesia Plan  ASA: III  Anesthesia Plan: General   Post-op Pain Management:    Induction: Intravenous  PONV Risk Score and Plan: Propofol infusion and TIVA  Airway Management Planned: Nasal Cannula  Additional Equipment:   Intra-op Plan:   Post-operative Plan:   Informed Consent: I have reviewed the patients History and Physical, chart, labs and discussed the procedure including the risks, benefits and alternatives for the proposed anesthesia with the patient or authorized representative who has indicated his/her understanding and acceptance.       Plan Discussed with:   Anesthesia Plan Comments:         Anesthesia Quick Evaluation

## 2019-04-18 NOTE — Consult Note (Signed)
GI Inpatient Consult Note  Reason for Consult: Melena, Coffee-ground emesis    Attending Requesting Consult: Dr. Maryfrances Bunnell  History of Present Illness: Evan Edwards is a 48 y.o. male seen for evaluation of melena and coffee-ground emesis at the request of Dr. Maryfrances Bunnell. Pt has a PMH of T2DM, beta thalassemia, GERD, sleep apnea. He is established with [facility] and called in to our office yesterday for complaints of coffee-ground emesis and melena over the past week. He was advised to go immediately to the ED for further evaluation. He reports that over the past week he has been experiencing 1-2 episodes of coffee-ground emesis. He denies any frank hematemesis. He reports appx 3 episodes of black, tarry stools over the past week as well. He reports this has never occurred before. He reports that over the past two years he has been having several episodes of non-bloody and non-bilious emesis of unknown origin. He had EGD and colonoscopy performed 09/2018 by Dr. Marva Panda which showed Barrett's esophagus wo dysplasia. Colonoscopy showed diverticulosis. He denies any nausea, vomiting, dysphagia, odynophagia, early satiety, or abdominal pain. He reports he has not been using NSAIDs over the past several months, but reports a history of taking ibuprofen or aleve several months ago for chronic pains. He reports he does not take oral iron or use Pepto-Bismol. He denies any bright red bloody stools. He denies any lower abdominal cramping, rectal pain. No known family history of esophageal, gastric, pancreatic, or colon malignancies. He reports on an average weekend he can drink a 24-pack of beer and does drink 3-4 beers a night during the week. Upon presentation to the ED, he was found to have hemoglobin 13.5, WBC 14. He reports one bowel movement this morning that was a dark brown color. No emesis today. He denies fever, chills, night sweats. No sick contacts or recent travel.    Last Colonoscopy: 09/2018 -  Diverticulosis in the sigmoid colon and in the descending colon. - One 8 mm polyp in the distal transverse colon, removed with a cold snare. Resected and retrieved. - The distal rectum and anal verge are normal on retroflexion view. - Erosive dermatitis/moist masceration found on perianal exam. Last Endoscopy: 09/2018 -Barrett's esophagus wo dysplasia -Gastritis    Past Medical History:  Past Medical History:  Diagnosis Date  . Anemia   . Asthma   . Bacterial pneumonia 2014  . Beta thalassemia (HCC)   . Emphysema lung (HCC)   . GERD (gastroesophageal reflux disease)   . Sleep apnea    uses cpap    Problem List: Patient Active Problem List   Diagnosis Date Noted  . Hematemesis 04/17/2019  . Alcohol abuse 04/17/2019  . Controlled type 2 diabetes mellitus with hyperglycemia (HCC) 04/17/2019  . Pneumonitis 02/21/2018    Past Surgical History: Past Surgical History:  Procedure Laterality Date  . APPENDECTOMY    . Bacterial Pneumonia Surgery     Pt unable to state what exactly was done, states "they went inside of removed it".  . COLONOSCOPY WITH PROPOFOL N/A 09/29/2018   Procedure: COLONOSCOPY WITH PROPOFOL;  Surgeon: Christena Deem, MD;  Location: [facility] ENDOSCOPY;  Service: Endoscopy;  Laterality: N/A;  . ESOPHAGOGASTRODUODENOSCOPY (EGD) WITH PROPOFOL N/A 09/29/2018   Procedure: ESOPHAGOGASTRODUODENOSCOPY (EGD) WITH PROPOFOL;  Surgeon: Christena Deem, MD;  Location: [facility] ENDOSCOPY;  Service: Endoscopy;  Laterality: N/A;  . UMBILICAL HERNIA REPAIR N/A 01/05/2018   Procedure: HERNIA REPAIR UMBILICAL ADULT WITH MESH;  Surgeon: Sung Amabile, DO;  Location:  [facility] ORS;  Service: General;  Laterality: N/A;  . UPPER GI ENDOSCOPY      Allergies: No Known Allergies  Home Medications: (Not in a hospital admission)  Home medication reconciliation was completed with the patient.   Scheduled Inpatient Medications:   . folic acid  1 mg Oral Daily  . insulin aspart  0-9 Units  Subcutaneous Q4H  . LORazepam  0-4 mg Intravenous Q6H   Followed by  . [START ON 04/19/2019] LORazepam  0-4 mg Intravenous Q12H  . metoprolol succinate  25 mg Oral Daily  . multivitamin with minerals  1 tablet Oral Daily  . [START ON 04/21/2019] pantoprazole  40 mg Intravenous Q12H  . thiamine  100 mg Oral Daily   Or  . thiamine  100 mg Intravenous Daily    Continuous Inpatient Infusions:   . pantoprozole (PROTONIX) infusion 8 mg/hr (04/18/19 1223)    PRN Inpatient Medications:  acetaminophen **OR** acetaminophen, LORazepam **OR** LORazepam, ondansetron **OR** ondansetron (ZOFRAN) IV  Family History: Family history is unknown by patient.  The patient's family history is negative for inflammatory bowel disorders, GI malignancy, or solid organ transplantation.  Social History:   reports that he has never smoked. He has quit using smokeless tobacco. He reports current alcohol use of about 4.0 standard drinks of alcohol per week. He reports that he does not use drugs. The patient denies ETOH, tobacco, or drug use.   Review of Systems: Constitutional: Weight is stable.  Eyes: No changes in vision. ENT: No oral lesions, sore throat.  GI: see HPI.  Heme/Lymph: No easy bruising.  CV: No chest pain.  GU: No hematuria.  Integumentary: No rashes.  Neuro: No headaches.  Psych: No depression/anxiety.  Endocrine: No heat/cold intolerance.  Allergic/Immunologic: No urticaria.  Resp: No cough, SOB.  Musculoskeletal: No joint swelling.    Physical Examination: BP 134/89 (BP Location: Right Arm)   Pulse 75   Temp 98.2 F (36.8 C) (Oral)   Resp 16   Ht 5\' 8"  (1.727 m)   Wt 108.9 kg   SpO2 95%   BMI 36.49 kg/m  Gen: NAD, alert and oriented x 4 HEENT: PEERLA, EOMI, Neck: supple, no JVD or thyromegaly Chest: CTA bilaterally, no wheezes, crackles, or other adventitious sounds CV: RRR, no m/g/c/r Abd: soft, NT, ND, +BS in all four quadrants; no HSM, guarding, ridigity, or rebound  tenderness Ext: no edema, well perfused with 2+ pulses, Skin: no rash or lesions noted Lymph: no LAD  Data: Lab Results  Component Value Date   WBC 7.6 04/18/2019   HGB 12.9 (L) 04/18/2019   HCT 48.7 04/18/2019   MCV 67.1 (L) 04/18/2019   PLT 212 04/18/2019   Recent Labs  Lab 04/17/19 1532 04/17/19 2140 04/18/19 0531  HGB 13.5 13.2 12.9*   Lab Results  Component Value Date   NA 137 04/18/2019   K 3.2 (L) 04/18/2019   CL 95 (L) 04/18/2019   CO2 29 04/18/2019   BUN 7 04/18/2019   CREATININE 0.66 04/18/2019   Lab Results  Component Value Date   ALT 26 04/17/2019   AST 25 04/17/2019   ALKPHOS 72 04/17/2019   BILITOT 0.7 04/17/2019   Recent Labs  Lab 04/17/19 2140  INR 1.1   EGD/Colon Path 09/2018: DIAGNOSIS:  A. STOMACH, ANTRUM; COLD BIOPSY:  - MILD REACTIVE GASTROPATHY.  - NEGATIVE FOR ACTIVE INFLAMMATION, H. PYLORI, INTESTINAL METAPLASIA, DYSPLASIA, AND MALIGNANCY.   B. STOMACH, BODY; COLD BIOPSY:  - OXYNTIC MUCOSA WITH FEATURES  SUGGESTIVE OF PROTON PUMP INHIBITOR EFFECT.  - NEGATIVE FOR H. PYLORI, INTESTINAL METAPLASIA, DYSPLASIA, AND MALIGNANCY.   C. ESOPHAGUS AT 40 CM; COLD BIOPSY:  - SQUAMOCOLUMNAR MUCOSA WITH INTESTINAL METAPLASIA, CONSISTENT WITH BARRETT'S ESOPHAGUS.  - NEGATIVE FOR DYSPLASIA AND MALIGNANCY.   D. ESOPHAGUS AT 38 CM; COLD BIOPSY:  - COLUMNAR-LINED MUCOSA WITH INTESTINAL METAPLASIA, CONSISTENT WITH BARRETT'S ESOPHAGUS.  - DETACHED FRAGMENT OF SQUAMOUS EPITHELIUM.  - NEGATIVE FOR DYSPLASIA AND MALIGNANCY.   E. ESOPHAGUS AT 36 CM; COLD BIOPSY:  - COLUMNAR-LINED MUCOSA WITH INTESTINAL METAPLASIA, CONSISTENT WITH BARRETT'S ESOPHAGUS.  - NEGATIVE FOR DYSPLASIA AND MALIGNANCY.   F. ESOPHAGUS AT 34 CM; COLD BIOPSY:  - COLUMNAR-LINED MUCOSA WITH INTESTINAL METAPLASIA, CONSISTENT WITH BARRETT'S ESOPHAGUS.  - NEGATIVE FOR DYSPLASIA AND MALIGNANCY.   G. ESOPHAGUS AT 32 CM; COLD BIOPSY:  - COLUMNAR-LINED MUCOSA WITH INTESTINAL  METAPLASIA, CONSISTENT WITH  BARRETT'S ESOPHAGUS.  - NEGATIVE FOR DYSPLASIA AND MALIGNANCY.   H. ESOPHAGUS AT 30 CM; COLD BIOPSY:  - COLUMNAR-LINED MUCOSA WITH INTESTINAL METAPLASIA, CONSISTENT WITH BARRETT'S ESOPHAGUS.  - NEGATIVE FOR DYSPLASIA AND MALIGNANCY.   I. ESOPHAGUS AT 28 CM; COLD BIOPSY:  - SQUAMOCOLUMNAR MUCOSA WITH INTESTINAL METAPLASIA, CONSISTENT WITH BARRETT'S ESOPHAGUS.  - NEGATIVE FOR DYSPLASIA AND MALIGNANCY.   J. COLON POLYP, DISTAL TRANSVERSE; COLD SNARE:  - POLYPOID MUCOSA WITH REACTIVE CRYPT HYPERPLASIA, MARKED CHRONIC INFLAMMATION, AND SURFACE EROSION, CONSISTENT WITH INFLAMMATORY POLYP.  - NEGATIVE FOR DYSPLASIA AND MALIGNANCY.   Assessment/Plan:  48 y/o Caucasian male with a PMH of sleep apnea, beta thalassemia, T2DM, GERD presented to ED for coffee-ground emesis and melena x1 week  1. Coffee-ground emesis 2. Melena 3. Alcohol abuse - CIWA protocol 4. GERD 5. Barrett's esophagus wo dysplasia  -Differential includes Mallory-Weiss tear, esophagitis, gastritis +/- H pylori, AVMs, duodenitis, peptic ulcer disease  -Hemodynamically stable with hemoglobin >13. Continue to monitor H&H. Transfuse for Hgb <7.0. -Agree with Protonix acid suppression. He needs to be on long-term therapy given his newly diagnosed Barrett's esophagus -Advise EGD for luminal evaluation for diagnostic and potentially therapeutic reasons. We discussed procedure details and indications today. -Remain NPO -COVID status - NEGATIVE -Plan for EGD this afternoon with Dr. Norma Fredricksonoledo -See procedure note for findings and further recommendations  I reviewed the risks (including bleeding, perforation, infection, anesthesia complications, cardiac/respiratory complications), benefits and alternatives of EGD. Patient consents to proceed.    Thank you for the consult. Please call with questions or concerns.  Mickle MalloryGranville M Charolette Bultman, PA-C [facility] [phone]  (Cell)

## 2019-04-18 NOTE — Interval H&P Note (Signed)
History and Physical Interval Note:  04/18/2019 4:08 PM  Evan Edwards  has presented today for surgery, with the diagnosis of Coffee-ground emesis, melena.  The various methods of treatment have been discussed with the patient and family. After consideration of risks, benefits and other options for treatment, the patient has consented to  Procedure(s): ESOPHAGOGASTRODUODENOSCOPY (EGD) (N/A) as a surgical intervention.  The patient's history has been reviewed, patient examined, no change in status, stable for surgery.  I have reviewed the patient's chart and labs.  Questions were answered to the patient's satisfaction.     [place], [place]

## 2019-04-18 NOTE — ED Notes (Signed)
Pt up to the bathroom at this time, NAD noted, pt ambulatory without difficulty. Will continue to monitor for further patient needs.

## 2019-04-19 ENCOUNTER — Encounter: Payer: Self-pay | Admitting: Internal Medicine

## 2019-04-19 DIAGNOSIS — F1010 Alcohol abuse, uncomplicated: Secondary | ICD-10-CM

## 2019-04-19 DIAGNOSIS — D62 Acute posthemorrhagic anemia: Secondary | ICD-10-CM

## 2019-04-19 DIAGNOSIS — K226 Gastro-esophageal laceration-hemorrhage syndrome: Principal | ICD-10-CM

## 2019-04-19 LAB — CBC
HCT: 49.6 % (ref 39.0–52.0)
Hemoglobin: 13.6 g/dL (ref 13.0–17.0)
MCH: 18.1 pg — ABNORMAL LOW (ref 26.0–34.0)
MCHC: 27.4 g/dL — ABNORMAL LOW (ref 30.0–36.0)
MCV: 66.1 fL — ABNORMAL LOW (ref 80.0–100.0)
Platelets: 235 10*3/uL (ref 150–400)
RBC: 7.5 MIL/uL — ABNORMAL HIGH (ref 4.22–5.81)
RDW: 22.3 % — ABNORMAL HIGH (ref 11.5–15.5)
WBC: 7.3 10*3/uL (ref 4.0–10.5)
nRBC: 0.3 % — ABNORMAL HIGH (ref 0.0–0.2)

## 2019-04-19 LAB — GLUCOSE, CAPILLARY
Glucose-Capillary: 120 mg/dL — ABNORMAL HIGH (ref 70–99)
Glucose-Capillary: 123 mg/dL — ABNORMAL HIGH (ref 70–99)

## 2019-04-19 MED ORDER — PANTOPRAZOLE SODIUM 40 MG PO TBEC: 40.0000 mg | DELAYED_RELEASE_TABLET | Freq: Two times a day (BID) | ORAL | 0 refills | Status: AC

## 2019-04-19 NOTE — TOC Initial Note (Signed)
Transition of Care [facility]) - Initial/Assessment Note    Patient Details  Name: Evan Edwards MRN: [id number] Date of Birth: 03/08/1971  Transition of Care [facility]) CM/SW Contact:    Evan Rogers, LCSW Phone Number: 04/19/2019, 9:56 AM  Clinical Narrative:                  Patient came in from home. Patient presented with DM, beta thalssemia, OSA, and alcohol use disorder. Patient lives at home with his girlfriend. Patient does have a PCP provider: Harrel Edwards through Internal Medicine. [facility] and does not have an issue obtaining his medications. Patient is independent and ambulated at home and is not in need of any DME. Patient can drive himself and has his girlfriend who can assist with any care at home, if needed. Patient was given options for Substance Abuse Treatment but denied wanting any SA treatment.   Expected Discharge Plan: Home/Self Care     Patient Goals and CMS Choice Patient states their goals for this hospitalization and ongoing recovery are:: "to go back home" CMS Medicare.gov Compare Post Acute Care list provided to:: Patient Choice offered to / list presented to : Patient  Expected Discharge Plan and Services Expected Discharge Plan: Home/Self Care In-house Referral: Clinical Social Work     Living arrangements for the past 2 months: Single Family Home Expected Discharge Date: 04/19/19                                    Prior Living Arrangements/Services Living arrangements for the past 2 months: Single Family Home Lives with:: Self, Significant Other Patient language and need for interpreter reviewed:: Yes Do you feel safe going back to the place where you live?: Yes      Need for Family Participation in Patient Care: Yes (Comment) Care giver support system in place?: Yes (comment)   Criminal Activity/Legal Involvement Pertinent to Current Situation/Hospitalization: No - Comment as needed  Activities of  Daily Living Home Assistive Devices/Equipment: None ADL Screening (condition at time of admission) Patient's cognitive ability adequate to safely complete daily activities?: Yes Is the patient deaf or have difficulty hearing?: No Does the patient have difficulty seeing, even when wearing glasses/contacts?: No Does the patient have difficulty concentrating, remembering, or making decisions?: No Patient able to express need for assistance with ADLs?: Yes Does the patient have difficulty dressing or bathing?: No Independently performs ADLs?: Yes (appropriate for developmental age) Does the patient have difficulty walking or climbing stairs?: No Weakness of Legs: None Weakness of Arms/Hands: None  Permission Sought/Granted                  Emotional Assessment Appearance:: Appears stated age Attitude/Demeanor/Rapport: Apprehensive, Engaged Affect (typically observed): Adaptable Orientation: : Oriented to Self, Oriented to Place, Oriented to  Time, Oriented to Situation Alcohol / Substance Use: Alcohol Use Psych Involvement: No (comment)  Admission diagnosis:  Gastrointestinal hemorrhage, unspecified gastrointestinal hemorrhage type [K92.2] Hematemesis [K92.0] Patient Active Problem List   Diagnosis Date Noted  . Mallory-Weiss tear 04/18/2019  . Hematemesis 04/17/2019  . Alcohol abuse 04/17/2019  . Controlled type 2 diabetes mellitus with hyperglycemia ([place]) 04/17/2019  . Pneumonitis 02/21/2018   PCP:  Evan Lemon, MD Pharmacy:   [id number] [place], [place] [address] [address] Phone: [phone] Fax: [phone]  Social Determinants of Health (SDOH) Interventions    Readmission Risk Interventions No flowsheet data found.

## 2019-04-19 NOTE — Discharge Summary (Signed)
Physician Discharge Summary  VARDAAN DEPASCALE QZR:[id number] DOB: 1970/11/14 DOA: 04/17/2019  PCP: [name], MD  Admit date: 04/17/2019 Discharge date: 04/19/2019  Admitted From: Home  Disposition:  Home   Recommendations for Outpatient Follow-up:  1. Follow up with PCP in 1-2 weeks 2. Follow up with GI as needed     Home Health: None  Equipment/Devices: None  Discharge Condition: Good  CODE STATUS: FULL Diet recommendation: Regular  Brief/Interim Summary: Mr. Evan Edwards is a 48 y.o. M with DM, beta thalassemia, OSA, and alcohol use disorder who presented from GI clinic with concerns of GI bleed.  Had had 1 week throwing up dark-colored fluids as well as black stools and colicky left lower quadrant abdominal discomfort.  In the ER hemoglobin 13.5, transaminases normal.  Creatinine normal.  Started on Protonix,  GI consulted.         PRINCIPAL HOSPITAL DIAGNOSIS: Hematemesis due to Mallory-Weiss tear    Discharge Diagnoses:   Hematemesis due to Mallory-Weiss tear The patient was admitted and started on IV Protonix.  Had no further hematemesis or melena.  GI were consulted, Dr. Alice Reichert performed EGD which showed no ulcer, no evidence of active bleeding, but did show Mallory-Weiss tear, healing in the esophagus.    He was recommended to take twice daily Protonix for 1 month, follow-up with GI as needed    Diabetes Glucoses well controlled  Alcohol use disorder Counseled to reduce alcohol use  History of palpitations Hypertension  Leukocytosis Likely reactionary, no suspicion for infection             Discharge Instructions  Discharge Instructions    Discharge instructions   Complete by: As directed    From Dr. Loleta Books: You were admitted for throwing up blood. It turned out this was from a [employer] tear, which is the fancy name for a small rip in the esophagus. As we discussed, these heal up by themselves. You  should take pantoprazole/Protonix 40 mg twice daily for the next month, then go back to your normal dosage Call Dr. Ricky Stabs office to see if you need to follow up with them.  Reduce alcohol to <2 drinks per day  Resume all your other home meds  Return to the hospital or seek care for new severe chest pain, vomiting blood again, black and tarry bowel movements, or fever and chest pain.   Increase activity slowly   Complete by: As directed      Allergies as of 04/19/2019   No Known Allergies     Medication List    TAKE these medications   bumetanide 1 MG tablet Commonly known as: BUMEX Take 1 mg by mouth 2 (two) times daily.   cetirizine 10 MG tablet Commonly known as: ZYRTEC Take 10 mg by mouth every morning.   ferrous sulfate 325 (65 FE) MG EC tablet Take 325 mg by mouth daily.   loratadine 10 MG tablet Commonly known as: CLARITIN Take 10 mg by mouth every evening.   metFORMIN 1000 MG tablet Commonly known as: GLUCOPHAGE Take 1,000 mg by mouth 2 (two) times daily.   metoprolol succinate 25 MG 24 hr tablet Commonly known as: TOPROL-XL Take 25 mg by mouth daily.   pantoprazole 40 MG tablet Commonly known as: PROTONIX Take 1 tablet (40 mg total) by mouth 2 (two) times daily before a meal. What changed: when to take this   testosterone cypionate 200 MG/ML injection Commonly known as: DEPOTESTOSTERONE CYPIONATE Inject 200 mg into the muscle  every 14 (fourteen) days.      Follow-up Information    [place], Boykin Nearing, MD. Schedule an appointment as soon as possible for a visit on 05/18/2019.   Specialty: Gastroenterology Why: 9am appointment Contact information: [address] [place] [address] [phone]          No Known Allergies  Consultations:  Gastroenterology, Dr. Norma Fredrickson   Procedures/Studies: 11/18 EGD Findings:      A 7 mm non-bleeding Mallory-Weiss tear with no stigmata of recent       bleeding was found.      A 4 cm hiatal  hernia was present. Estimated blood loss: none.      The examined duodenum was normal.      The exam was otherwise without abnormality. Impression:            - Mallory-Weiss tear.                        - 4 cm hiatal hernia.                        - Normal examined duodenum.                        - The examination was otherwise normal.                        - No specimens collected. Recommendation:        - Return patient to hospital ward for ongoing care.                        - Clear liquid diet.                        - Advance diet as tolerated.                        - Use Protonix (pantoprazole) 40 mg PO BID.                        - See inpatient chart and orders for further                         recommendations                        - Call us back if we can help - GI sign off.      Subjective: Feeling well.  Tolerating a solid diet last night, no further hematemesis, vomiting, melena.  No dizziness chest pain.  No fever.  Discharge Exam: Vitals:   04/18/19 1644 04/18/19 2026  BP: 140/81 (!) 146/97  Pulse:  88  Resp: (!) 22 18  Temp:  98.1 F (36.7 C)  SpO2: 95% 95%   Vitals:   04/18/19 1619 04/18/19 1641 04/18/19 1644 04/18/19 2026  BP: (!) 156/95  140/81 (!) 146/97  Pulse: 75   88  Resp: 18  (!) 22 18  Temp: (!) 97.5 F (36.4 C) 97.8 F (36.6 C)  98.1 F (36.7 C)  TempSrc: Temporal Temporal  Oral  SpO2: 99%  95% 95%  Weight: 108.9 kg     Height: 5\' 8"  (1.727 m)       General: Pt  is alert, awake, not in acute distress, lying in bed, interactive. Cardiovascular: RRR, nl S1-S2, no murmurs appreciated.   No LE edema.   Respiratory: Normal respiratory rate and rhythm.  CTAB without rales or wheezes. Abdominal: Abdomen soft and non-tender.  No distension or HSM.   Neuro/Psych: Strength symmetric in upper and lower extremities.  Judgment and insight appear normal.   The results of significant diagnostics from this hospitalization (including imaging,  microbiology, ancillary and laboratory) are listed below for reference.     Microbiology: Recent Results (from the past 240 hour(s))  SARS CORONAVIRUS 2 (TAT 6-24 HRS) Nasopharyngeal Nasopharyngeal Swab     Status: None   Collection Time: 04/17/19  7:29 PM   Specimen: Nasopharyngeal Swab  Result Value Ref Range Status   SARS Coronavirus 2 NEGATIVE NEGATIVE Final    Comment: (NOTE) SARS-CoV-2 target nucleic acids are NOT DETECTED. The SARS-CoV-2 RNA is generally detectable in upper and lower respiratory specimens during the acute phase of infection. Negative results do not preclude SARS-CoV-2 infection, do not rule out co-infections with other pathogens, and should not be used as the sole basis for treatment or other patient management decisions. Negative results must be combined with clinical observations, patient history, and epidemiological information. The expected result is Negative. Fact Sheet for Patients: HairSlick.nohttps://www.fda.gov/media/138098/download Fact Sheet for Healthcare Providers: quierodirigir.comhttps://www.fda.gov/media/138095/download This test is not yet approved or cleared by the [place] FDA and  has been authorized for detection and/or diagnosis of SARS-CoV-2 by FDA under an Emergency Use Authorization (EUA). This EUA will remain  in effect (meaning this test can be used) for the duration of the COVID-19 declaration under Section 56 4(b)(1) of the Act, 21 U.S.C. section 360bbb-3(b)(1), unless the authorization is terminated or revoked sooner. Performed at [facility], [address] [place], [place] [address]      Labs: BNP (last 3 results) No results for input(s): BNP in the last 8760 hours. Basic Metabolic Panel: Recent Labs  Lab 04/17/19 1532 04/18/19 0531  NA 135 137  K 3.5 3.2*  CL 97* 95*  CO2 26 29  GLUCOSE 139* 123*  BUN 7 7  CREATININE 0.83 0.66  CALCIUM 9.5 8.7*   Liver Function Tests: Recent Labs  Lab 04/17/19 1532  AST 25  ALT 26   ALKPHOS 72  BILITOT 0.7  PROT 7.5  ALBUMIN 3.4*   No results for input(s): LIPASE, AMYLASE in the last 168 hours. No results for input(s): AMMONIA in the last 168 hours. CBC: Recent Labs  Lab 04/17/19 1532 04/17/19 2140 04/18/19 0531 04/19/19 0440  WBC 14.0* 10.9* 7.6 7.3  HGB 13.5 13.2 12.9* 13.6  HCT 48.2 49.1 48.7 49.6  MCV 64.8* 67.0* 67.1* 66.1*  PLT 246 231 212 235   Cardiac Enzymes: No results for input(s): CKTOTAL, CKMB, CKMBINDEX, TROPONINI in the last 168 hours. BNP: Invalid input(s): POCBNP CBG: Recent Labs  Lab 04/18/19 0906 04/18/19 1135 04/18/19 1609 04/18/19 2214 04/19/19 0358  GLUCAP 119* 93 93 147* 120*   D-Dimer No results for input(s): DDIMER in the last 72 hours. Hgb A1c Recent Labs    04/17/19 2140  HGBA1C 6.4*   Lipid Profile No results for input(s): CHOL, HDL, LDLCALC, TRIG, CHOLHDL, LDLDIRECT in the last 72 hours. Thyroid function studies No results for input(s): TSH, T4TOTAL, T3FREE, THYROIDAB in the last 72 hours.  Invalid input(s): FREET3 Anemia work up No results for input(s): VITAMINB12, FOLATE, FERRITIN, TIBC, IRON, RETICCTPCT in the last 72 hours. Urinalysis  Component Value Date/Time   COLORURINE YELLOW (A) 02/22/2018 0654   APPEARANCEUR CLEAR (A) 02/22/2018 0654   LABSPEC 1.032 (H) 02/22/2018 0654   PHURINE 5.0 02/22/2018 0654   GLUCOSEU NEGATIVE 02/22/2018 0654   HGBUR NEGATIVE 02/22/2018 0654   BILIRUBINUR NEGATIVE 02/22/2018 0654   KETONESUR NEGATIVE 02/22/2018 0654   PROTEINUR NEGATIVE 02/22/2018 0654   NITRITE NEGATIVE 02/22/2018 0654   LEUKOCYTESUR NEGATIVE 02/22/2018 0654   Sepsis Labs Invalid input(s): PROCALCITONIN,  WBC,  LACTICIDVEN Microbiology Recent Results (from the past 240 hour(s))  SARS CORONAVIRUS 2 (TAT 6-24 HRS) Nasopharyngeal Nasopharyngeal Swab     Status: None   Collection Time: 04/17/19  7:29 PM   Specimen: Nasopharyngeal Swab  Result Value Ref Range Status   SARS Coronavirus 2  NEGATIVE NEGATIVE Final    Comment: (NOTE) SARS-CoV-2 target nucleic acids are NOT DETECTED. The SARS-CoV-2 RNA is generally detectable in upper and lower respiratory specimens during the acute phase of infection. Negative results do not preclude SARS-CoV-2 infection, do not rule out co-infections with other pathogens, and should not be used as the sole basis for treatment or other patient management decisions. Negative results must be combined with clinical observations, patient history, and epidemiological information. The expected result is Negative. Fact Sheet for Patients: HairSlick.no Fact Sheet for Healthcare Providers: quierodirigir.com This test is not yet approved or cleared by the [place] FDA and  has been authorized for detection and/or diagnosis of SARS-CoV-2 by FDA under an Emergency Use Authorization (EUA). This EUA will remain  in effect (meaning this test can be used) for the duration of the COVID-19 declaration under Section 56 4(b)(1) of the Act, 21 U.S.C. section 360bbb-3(b)(1), unless the authorization is terminated or revoked sooner. Performed at [facility], [address], [place], [place] [address]      Time coordinating discharge: 25 minutes      SIGNED:   Alberteen Sam, MD  [facility] 04/19/2019, 9:37 AM

## 2019-04-19 NOTE — Progress Notes (Signed)
Evan Edwards to be D/C'd home per MD order.  Discussed prescriptions and follow up appointments with the patient. Prescriptions given to patient, medication list explained in detail. Pt verbalized understanding.  Allergies as of 04/19/2019   No Known Allergies     Medication List    TAKE these medications   bumetanide 1 MG tablet Commonly known as: BUMEX Take 1 mg by mouth 2 (two) times daily.   cetirizine 10 MG tablet Commonly known as: ZYRTEC Take 10 mg by mouth every morning.   ferrous sulfate 325 (65 FE) MG EC tablet Take 325 mg by mouth daily.   loratadine 10 MG tablet Commonly known as: CLARITIN Take 10 mg by mouth every evening.   metFORMIN 1000 MG tablet Commonly known as: GLUCOPHAGE Take 1,000 mg by mouth 2 (two) times daily.   metoprolol succinate 25 MG 24 hr tablet Commonly known as: TOPROL-XL Take 25 mg by mouth daily.   pantoprazole 40 MG tablet Commonly known as: PROTONIX Take 1 tablet (40 mg total) by mouth 2 (two) times daily before a meal. What changed: when to take this   testosterone cypionate 200 MG/ML injection Commonly known as: DEPOTESTOSTERONE CYPIONATE Inject 200 mg into the muscle every 14 (fourteen) days.       Vitals:   04/18/19 1644 04/18/19 2026  BP: 140/81 (!) 146/97  Pulse:  88  Resp: (!) 22 18  Temp:  98.1 F (36.7 C)  SpO2: 95% 95%    Skin clean, dry and intact without evidence of skin break down, no evidence of skin tears noted. IV catheter discontinued intact. Site without signs and symptoms of complications. Dressing and pressure applied. Pt denies pain at this time. No complaints noted.  An After Visit Summary was printed and given to the patient. Patient escorted via [place], and D/C home via private auto.  Chuck Hint RN [facility] [employer]

## 2019-11-06 ENCOUNTER — Emergency Department: Payer: BC Managed Care – PPO

## 2019-11-06 ENCOUNTER — Other Ambulatory Visit: Payer: Self-pay

## 2019-11-06 ENCOUNTER — Inpatient Hospital Stay
Admission: EM | Admit: 2019-11-06 | Discharge: 2019-11-08 | DRG: 309 | Disposition: A | Discharge status: Home / Self Care | Payer: BC Managed Care – PPO | Attending: Internal Medicine | Admitting: Internal Medicine

## 2019-11-06 DIAGNOSIS — Z6841 Body Mass Index (BMI) 40.0 and over, adult: Secondary | ICD-10-CM

## 2019-11-06 DIAGNOSIS — D509 Iron deficiency anemia, unspecified: Secondary | ICD-10-CM | POA: Diagnosis present

## 2019-11-06 DIAGNOSIS — Z87891 Personal history of nicotine dependence: Secondary | ICD-10-CM

## 2019-11-06 DIAGNOSIS — Z79890 Hormone replacement therapy (postmenopausal): Secondary | ICD-10-CM | POA: Diagnosis not present

## 2019-11-06 DIAGNOSIS — I4891 Unspecified atrial fibrillation: Principal | ICD-10-CM | POA: Diagnosis present

## 2019-11-06 DIAGNOSIS — Z79899 Other long term (current) drug therapy: Secondary | ICD-10-CM | POA: Diagnosis not present

## 2019-11-06 DIAGNOSIS — R251 Tremor, unspecified: Secondary | ICD-10-CM | POA: Diagnosis present

## 2019-11-06 DIAGNOSIS — Z8249 Family history of ischemic heart disease and other diseases of the circulatory system: Secondary | ICD-10-CM | POA: Diagnosis not present

## 2019-11-06 DIAGNOSIS — E66813 Obesity, class 3: Secondary | ICD-10-CM | POA: Diagnosis present

## 2019-11-06 DIAGNOSIS — G4733 Obstructive sleep apnea (adult) (pediatric): Secondary | ICD-10-CM | POA: Diagnosis present

## 2019-11-06 DIAGNOSIS — F1010 Alcohol abuse, uncomplicated: Secondary | ICD-10-CM | POA: Diagnosis present

## 2019-11-06 DIAGNOSIS — I10 Essential (primary) hypertension: Secondary | ICD-10-CM | POA: Diagnosis present

## 2019-11-06 DIAGNOSIS — E1165 Type 2 diabetes mellitus with hyperglycemia: Secondary | ICD-10-CM | POA: Diagnosis present

## 2019-11-06 DIAGNOSIS — J439 Emphysema, unspecified: Secondary | ICD-10-CM | POA: Diagnosis present

## 2019-11-06 DIAGNOSIS — Z20822 Contact with and (suspected) exposure to covid-19: Secondary | ICD-10-CM | POA: Diagnosis present

## 2019-11-06 DIAGNOSIS — Z7984 Long term (current) use of oral hypoglycemic drugs: Secondary | ICD-10-CM

## 2019-11-06 DIAGNOSIS — Z8701 Personal history of pneumonia (recurrent): Secondary | ICD-10-CM | POA: Diagnosis not present

## 2019-11-06 DIAGNOSIS — Z8719 Personal history of other diseases of the digestive system: Secondary | ICD-10-CM | POA: Diagnosis not present

## 2019-11-06 DIAGNOSIS — K219 Gastro-esophageal reflux disease without esophagitis: Secondary | ICD-10-CM | POA: Diagnosis present

## 2019-11-06 LAB — CBC WITH DIFFERENTIAL/PLATELET
Abs Immature Granulocytes: 0.08 10*3/uL — ABNORMAL HIGH (ref 0.00–0.07)
Basophils Absolute: 0.1 10*3/uL (ref 0.0–0.1)
Basophils Relative: 1 %
Eosinophils Absolute: 0.1 10*3/uL (ref 0.0–0.5)
Eosinophils Relative: 1 %
HCT: 52.3 % — ABNORMAL HIGH (ref 39.0–52.0)
Hemoglobin: 14.7 g/dL (ref 13.0–17.0)
Immature Granulocytes: 1 %
Lymphocytes Relative: 11 %
Lymphs Abs: 1.4 10*3/uL (ref 0.7–4.0)
MCH: 19.9 pg — ABNORMAL LOW (ref 26.0–34.0)
MCHC: 28.1 g/dL — ABNORMAL LOW (ref 30.0–36.0)
MCV: 70.9 fL — ABNORMAL LOW (ref 80.0–100.0)
Monocytes Absolute: 1.6 10*3/uL — ABNORMAL HIGH (ref 0.1–1.0)
Monocytes Relative: 13 %
Neutro Abs: 9 10*3/uL — ABNORMAL HIGH (ref 1.7–7.7)
Neutrophils Relative %: 73 %
Platelets: 187 10*3/uL (ref 150–400)
RBC: 7.38 MIL/uL — ABNORMAL HIGH (ref 4.22–5.81)
RDW: 22.7 % — ABNORMAL HIGH (ref 11.5–15.5)
Smear Review: NORMAL
WBC: 12.2 10*3/uL — ABNORMAL HIGH (ref 4.0–10.5)
nRBC: 0.2 % (ref 0.0–0.2)

## 2019-11-06 LAB — BASIC METABOLIC PANEL
Anion gap: 11 (ref 5–15)
BUN: 6 mg/dL (ref 6–20)
CO2: 26 mmol/L (ref 22–32)
Calcium: 8.2 mg/dL — ABNORMAL LOW (ref 8.9–10.3)
Chloride: 97 mmol/L — ABNORMAL LOW (ref 98–111)
Creatinine, Ser: 0.98 mg/dL (ref 0.61–1.24)
GFR calc Af Amer: >60 mL/min (ref >60)
GFR calc non Af Amer: >60 mL/min (ref >60)
Glucose, Bld: 175 mg/dL — ABNORMAL HIGH (ref 70–99)
Potassium: 3.5 mmol/L (ref 3.5–5.1)
Sodium: 134 mmol/L — ABNORMAL LOW (ref 135–145)

## 2019-11-06 LAB — HEPATIC FUNCTION PANEL
ALT: 38 U/L (ref 0–44)
AST: 39 U/L (ref 15–41)
Albumin: 3.1 g/dL — ABNORMAL LOW (ref 3.5–5.0)
Alkaline Phosphatase: 60 U/L (ref 38–126)
Bilirubin, Direct: 0.1 mg/dL (ref 0.0–0.2)
Indirect Bilirubin: 0.6 mg/dL (ref 0.3–0.9)
Total Bilirubin: 0.7 mg/dL (ref 0.3–1.2)
Total Protein: 6.6 g/dL (ref 6.5–8.1)

## 2019-11-06 LAB — URINE DRUG SCREEN, QUALITATIVE (ARMC ONLY)
Amphetamines, Ur Screen: NOT DETECTED
Barbiturates, Ur Screen: NOT DETECTED
Benzodiazepine, Ur Scrn: NOT DETECTED
Cannabinoid 50 Ng, Ur ~~LOC~~: NOT DETECTED
Cocaine Metabolite,Ur ~~LOC~~: NOT DETECTED
MDMA (Ecstasy)Ur Screen: NOT DETECTED
Methadone Scn, Ur: NOT DETECTED
Opiate, Ur Screen: NOT DETECTED
Phencyclidine (PCP) Ur S: NOT DETECTED
Tricyclic, Ur Screen: NOT DETECTED

## 2019-11-06 LAB — MAGNESIUM: Magnesium: 1.2 mg/dL — ABNORMAL LOW (ref 1.7–2.4)

## 2019-11-06 LAB — TSH: TSH: 3.239 u[IU]/mL (ref 0.350–4.500)

## 2019-11-06 LAB — T4, FREE: Free T4: 0.78 ng/dL (ref 0.61–1.12)

## 2019-11-06 LAB — ETHANOL: Alcohol, Ethyl (B): <10 mg/dL (ref <10)

## 2019-11-06 LAB — SARS CORONAVIRUS 2 BY RT PCR (HOSPITAL ORDER, PERFORMED IN ~~LOC~~ HOSPITAL LAB): SARS Coronavirus 2: NEGATIVE

## 2019-11-06 LAB — TROPONIN I (HIGH SENSITIVITY)
Troponin I (High Sensitivity): 16 ng/L (ref <18)
Troponin I (High Sensitivity): 20 ng/L — ABNORMAL HIGH (ref <18)

## 2019-11-06 MED ORDER — INSULIN ASPART 100 UNIT/ML ~~LOC~~ SOLN: 0.0000 [IU] | Freq: Every day | SUBCUTANEOUS | Status: DC

## 2019-11-06 MED ORDER — THIAMINE HCL 100 MG/ML IJ SOLN: 100.0000 mg | Freq: Every day | INTRAMUSCULAR | Status: DC

## 2019-11-06 MED ORDER — MAGNESIUM SULFATE 2 GM/50ML IV SOLN
2.0000 g | Freq: Once | INTRAVENOUS | Status: AC
  Administered 2019-11-06: 2 g via INTRAVENOUS
  Filled 2019-11-06: qty 50

## 2019-11-06 MED ORDER — ADULT MULTIVITAMIN W/MINERALS CH
1.0000 | ORAL_TABLET | Freq: Every day | ORAL | Status: DC
  Administered 2019-11-07 – 2019-11-08 (×2): 1 via ORAL
  Filled 2019-11-06 (×3): qty 1

## 2019-11-06 MED ORDER — LORAZEPAM 1 MG PO TABS: 1.0000 mg | ORAL_TABLET | ORAL | Status: DC | PRN

## 2019-11-06 MED ORDER — ASPIRIN EC 81 MG PO TBEC
81.0000 mg | DELAYED_RELEASE_TABLET | Freq: Every day | ORAL | Status: DC
  Administered 2019-11-07 – 2019-11-08 (×2): 81 mg via ORAL
  Filled 2019-11-06 (×3): qty 1

## 2019-11-06 MED ORDER — ONDANSETRON HCL 4 MG/2ML IJ SOLN: 4.0000 mg | Freq: Four times a day (QID) | INTRAMUSCULAR | Status: DC | PRN

## 2019-11-06 MED ORDER — LORAZEPAM 2 MG/ML IJ SOLN: 1.0000 mg | INTRAMUSCULAR | Status: DC | PRN

## 2019-11-06 MED ORDER — THIAMINE HCL 100 MG PO TABS
100.0000 mg | ORAL_TABLET | Freq: Every day | ORAL | Status: DC
  Administered 2019-11-07 – 2019-11-08 (×2): 100 mg via ORAL
  Filled 2019-11-06 (×3): qty 1

## 2019-11-06 MED ORDER — DILTIAZEM HCL-DEXTROSE 125-5 MG/125ML-% IV SOLN (PREMIX)
5.0000 mg/h | INTRAVENOUS | Status: DC
  Administered 2019-11-06: 5 mg/h via INTRAVENOUS

## 2019-11-06 MED ORDER — LACTATED RINGERS IV BOLUS
1000.0000 mL | Freq: Once | INTRAVENOUS | Status: AC
  Administered 2019-11-06: 1000 mL via INTRAVENOUS

## 2019-11-06 MED ORDER — INSULIN ASPART 100 UNIT/ML ~~LOC~~ SOLN
0.0000 [IU] | Freq: Three times a day (TID) | SUBCUTANEOUS | Status: DC
  Administered 2019-11-07 (×2): 3 [IU] via SUBCUTANEOUS
  Administered 2019-11-07: 2 [IU] via SUBCUTANEOUS
  Filled 2019-11-06 (×2): qty 1

## 2019-11-06 MED ORDER — ACETAMINOPHEN 325 MG PO TABS: 650.0000 mg | ORAL_TABLET | ORAL | Status: DC | PRN

## 2019-11-06 MED ORDER — LORAZEPAM 2 MG PO TABS
0.0000 mg | ORAL_TABLET | Freq: Four times a day (QID) | ORAL | Status: DC
  Filled 2019-11-06: qty 1

## 2019-11-06 MED ORDER — DILTIAZEM HCL-DEXTROSE 125-5 MG/125ML-% IV SOLN (PREMIX)
5.0000 mg/h | INTRAVENOUS | Status: DC
  Administered 2019-11-07: 10 mg/h via INTRAVENOUS
  Administered 2019-11-07: 7.5 mg/h via INTRAVENOUS
  Administered 2019-11-07: 5 mg/h via INTRAVENOUS
  Filled 2019-11-06: qty 125

## 2019-11-06 MED ORDER — DILTIAZEM LOAD VIA INFUSION
10.0000 mg | Freq: Once | INTRAVENOUS | Status: AC
  Administered 2019-11-06: 10 mg via INTRAVENOUS
  Filled 2019-11-06: qty 10

## 2019-11-06 MED ORDER — LORAZEPAM 2 MG PO TABS: 0.0000 mg | ORAL_TABLET | Freq: Two times a day (BID) | ORAL | Status: DC

## 2019-11-06 MED ORDER — FOLIC ACID 1 MG PO TABS
1.0000 mg | ORAL_TABLET | Freq: Every day | ORAL | Status: DC
  Administered 2019-11-07 – 2019-11-08 (×2): 1 mg via ORAL
  Filled 2019-11-06 (×3): qty 1

## 2019-11-06 NOTE — ED Provider Notes (Signed)
[facility] Emergency Department Provider Note   ____________________________________________   First MD Initiated Contact with Patient 11/06/19 1953     (approximate)  I have reviewed the triage vital signs and the nursing notes.   HISTORY  Chief Complaint A fib RVR    HPI Evan Edwards is a 49 y.o. male with possible history of asthma, beta thalassemia, diabetes, and alcohol abuse who presents to the ED for atrial fibrillation.  Patient states he has been feeling "not quite right" throughout most of the day today.  He reports feeling more sweaty than usual and having much less energy.  He does feel like his heart has been racing at times, but he denies any chest pain or shortness of breath.  He has a chronic cough that is unchanged and denies any fevers, chills, abdominal pain, dysuria, or hematuria.  He does deal with chronic nausea and vomiting, often vomits when he wakes up in the morning and this was unchanged today.  He no longer feels nauseous at this time.  He admits to regular alcohol consumption, about 4-5 beers daily, but denies any drug use.  He has a history of atrial fibrillation on 1 prior occasion when he was admitted for an empyema, does not currently take any anticoagulation.        Past Medical History:  Diagnosis Date  . Anemia   . Asthma   . Bacterial pneumonia 2014  . Beta thalassemia (HCC)   . Emphysema lung (HCC)   . GERD (gastroesophageal reflux disease)   . Sleep apnea    uses cpap    Patient Active Problem List   Diagnosis Date Noted  . Mallory-Weiss tear 04/18/2019  . Hematemesis 04/17/2019  . Alcohol abuse 04/17/2019  . Controlled type 2 diabetes mellitus with hyperglycemia (HCC) 04/17/2019  . Pneumonitis 02/21/2018    Past Surgical History:  Procedure Laterality Date  . APPENDECTOMY    . Bacterial Pneumonia Surgery     Pt unable to state what exactly was done, states "they went inside of removed it".  .  COLONOSCOPY WITH PROPOFOL N/A 09/29/2018   Procedure: COLONOSCOPY WITH PROPOFOL;  Surgeon: Christena Deem, MD;  Location[facility] ENDOSCOPY;  Service: Endoscopy;  Laterality: N/A;  . ESOPHAGOGASTRODUODENOSCOPY N/A 04/18/2019   Procedure: ESOPHAGOGASTRODUODENOSCOPY (EGD);  Surgeon: [place], Boykin Nearing, MD;  Location: [facility] ENDOSCOPY;  Service: Gastroenterology;  Laterality: N/A;  . ESOPHAGOGASTRODUODENOSCOPY (EGD) WITH PROPOFOL N/A 09/29/2018   Procedure: ESOPHAGOGASTRODUODENOSCOPY (EGD) WITH PROPOFOL;  Surgeon: Christena Deem, MD;  Location[facility] ENDOSCOPY;  Service: Endoscopy;  Laterality: N/A;  . UMBILICAL HERNIA REPAIR N/A 01/05/2018   Procedure: HERNIA REPAIR UMBILICAL ADULT WITH MESH;  Surgeon: Sung Amabile, DO;  Location: [facility] ORS;  Service: General;  Laterality: N/A;  . UPPER GI ENDOSCOPY      Prior to Admission medications   Medication Sig Start Date End Date Taking? Authorizing Provider  bumetanide (BUMEX) 1 MG tablet Take 1 mg by mouth 2 (two) times daily. 03/29/19   [provider]  cetirizine (ZYRTEC) 10 MG tablet Take 10 mg by mouth every morning.  12/12/17   [provider]  ferrous sulfate 325 (65 FE) MG EC tablet Take 325 mg by mouth daily. 01/13/19   [provider]  loratadine (CLARITIN) 10 MG tablet Take 10 mg by mouth every evening.    [provider]  metFORMIN (GLUCOPHAGE) 1000 MG tablet Take 1,000 mg by mouth 2 (two) times daily. 04/10/19   [provider]  metoprolol succinate (TOPROL-XL) 25 MG 24 hr tablet Take 25 mg by mouth daily. 04/10/19   [provider]  pantoprazole (PROTONIX) 40 MG tablet Take 1 tablet (40 mg total) by mouth 2 (two) times daily before a meal. 04/19/19   [place], Suann Larry, MD  testosterone cypionate (DEPOTESTOSTERONE CYPIONATE) 200 MG/ML injection Inject 200 mg into the muscle every 14 (fourteen) days. 04/11/19   [provider]    Allergies Patient has no known allergies.  Family  History  Family history unknown: Yes    Social History Social History   Tobacco Use  . Smoking status: Never Smoker  . Smokeless tobacco: Former Network engineer Use Topics  . Alcohol use: Yes    Alcohol/week: 4.0 standard drinks    Types: 4 Cans of beer per week  . Drug use: No    Review of Systems  Constitutional: No fever/chills.  Positive for diaphoresis and fatigue. Eyes: No visual changes. ENT: No sore throat. Cardiovascular: Denies chest pain.  Positive for palpitations. Respiratory: Denies shortness of breath. Gastrointestinal: No abdominal pain.  Positive for nausea and vomiting.  No diarrhea.  No constipation. Genitourinary: Negative for dysuria. Musculoskeletal: Negative for back pain. Skin: Negative for rash. Neurological: Negative for headaches, focal weakness or numbness.  ____________________________________________   PHYSICAL EXAM:  VITAL SIGNS: ED Triage Vitals  Enc Vitals Group     BP 11/06/19 1949 (!) 154/117     Pulse Rate 11/06/19 1949 74     Resp 11/06/19 1949 (!) 26     Temp 11/06/19 1949 98.2 F (36.8 C)     Temp Source 11/06/19 1949 Oral     SpO2 11/06/19 1949 96 %     Weight 11/06/19 1950 240 lb 1.3 oz (108.9 kg)     Height 11/06/19 1950 5\' 8"  (1.727 m)     Head Circumference --      Peak Flow --      Pain Score 11/06/19 1953 0     Pain Loc --      Pain Edu? --      Excl. in [place]? --     Constitutional: Alert and oriented.  Diaphoretic. Eyes: Conjunctivae are normal. Head: Atraumatic. Nose: No congestion/rhinnorhea. Mouth/Throat: Mucous membranes are moist. Neck: Normal ROM Cardiovascular: Tachycardic, irregularly irregular rhythm. Grossly normal heart sounds. Respiratory: Normal respiratory effort.  No retractions. Lungs CTAB. Gastrointestinal: Soft and nontender. No distention. Genitourinary: deferred Musculoskeletal: No lower extremity tenderness nor edema. Neurologic:  Normal speech and language. No gross focal neurologic  deficits are appreciated. Skin:  Skin is warm, dry and intact. No rash noted. Psychiatric: Mood and affect are normal. Speech and behavior are normal.  ____________________________________________   LABS (all labs ordered are listed, but only abnormal results are displayed)  Labs Reviewed  CBC WITH DIFFERENTIAL/PLATELET - Abnormal; Notable for the following components:      Result Value   WBC 12.2 (*)    RBC 7.38 (*)    HCT 52.3 (*)    MCV 70.9 (*)    MCH 19.9 (*)    MCHC 28.1 (*)    RDW 22.7 (*)    Neutro Abs 9.0 (*)    Monocytes Absolute 1.6 (*)    Abs Immature Granulocytes 0.08 (*)    All other components within normal limits  TSH  ETHANOL  BASIC METABOLIC PANEL  MAGNESIUM  HEPATIC FUNCTION PANEL  TROPONIN I (HIGH SENSITIVITY)   ____________________________________________  EKG  ED ECG REPORT I, Juanda Crumble  Rayn Shorb, the attending physician, personally viewed and interpreted this ECG.   Date: 11/06/2019  EKG Time: 19:49  Rate: 167  Rhythm: atrial fibrillation, rate 167  Axis: LLAD  Intervals:none  ST&T Change: None   PROCEDURES  Procedure(s) performed (including Critical Care):  .Critical Care Performed by: Chesley Noon, MD Authorized by: Chesley Noon, MD   Critical care provider statement:    Critical care time (minutes):  45   Critical care time was exclusive of:  Separately billable procedures and treating other patients and teaching time   Critical care was necessary to treat or prevent imminent or life-threatening deterioration of the following conditions:  Cardiac failure   Critical care was time spent personally by me on the following activities:  Discussions with consultants, evaluation of patient's response to treatment, examination of patient, ordering and performing treatments and interventions, ordering and review of laboratory studies, ordering and review of radiographic studies, pulse oximetry, re-evaluation of patient's condition, obtaining  history from patient or surrogate and review of old charts   I assumed direction of critical care for this patient from another provider in my specialty: no   .1-3 Lead EKG Interpretation Performed by: Chesley Noon, MD Authorized by: Chesley Noon, MD     Interpretation: abnormal     ECG rate:  157   ECG rate assessment: tachycardic     Rhythm: atrial fibrillation     Ectopy: none     Conduction: normal       ____________________________________________   INITIAL IMPRESSION / ASSESSMENT AND PLAN / ED COURSE       49 year old male with possible history of asthma, diabetes, and alcohol abuse presents to the ED for diaphoresis and "not feeling right" throughout the day today.  He does endorse occasional palpitations and noted to be in A. fib with RVR by EMS prior to arrival.  There was concern for dehydration and he was hydrated with IV fluids by EMS, but he continues to have rapid ventricular response.  We will start patient on Cardizem drip, his blood pressure remained stable for now.  His chads vas score appears to be only 1 and he may not require anticoagulation with this.  He denies any current infectious symptoms, it is possible that his regular alcohol consumption is contributing to the atrial fibrillation.  Labs including electrolytes, ethanol, and TSH are pending.  Anticipate admission for rate control.  Patient turned over to Dr. Colon Branch pending additional lab results and admission.      ____________________________________________   FINAL CLINICAL IMPRESSION(S) / ED DIAGNOSES  Final diagnoses:  Atrial fibrillation with RVR (HCC)  Alcohol abuse     ED Discharge Orders    None       Note:  This document was prepared using Dragon voice recognition software and may include unintentional dictation errors.   Chesley Noon, MD 11/06/19 2040

## 2019-11-06 NOTE — H&P (Signed)
History and Physical   [facility] [place] [facility] Admission History and Physical [employer], D.O.    Patient Name: Evan Edwards MR#: [id number] Date of Birth: Apr 24, 1971 Date of Admission: 11/06/2019  Referring MD/NP/PA: Dr. Colon Branch Primary Care Physician: Marcelino Duster, MD  Chief Complaint:  Chief Complaint  Patient presents with  . A fib RVR    HPI: Evan Edwards is a 49 y.o. male with a known history of alcohol use disorder with Mallory-Weiss tear, diabetes, COPD, GERD, sleep apnea on CPAP, asthma, atrial fibrillation on one prior episode in 2014 presents to the emergency department for evaluation of atrial fibrillation.  Patient was in a usual state of health until today when he reports generalized malaise, weakness, palpitations.  He does have a history of one episode of A. fib but is not currently on anticoagulation.  He is a regular alcohol user drinks 5-6 beers daily.Marland Kitchen  He did have some vomiting this morning which is not unusual for him.  Patient denies fevers/chills, chest pain, shortness of breath, abdominal pain, hematemesis or melena dysuria/frequency, changes in mental status.    Otherwise there has been no change in status. Patient has been taking medication as prescribed and there has been no recent change in medication or diet.  No recent antibiotics.  There has been no recent illness, hospitalizations, travel or sick contacts.    EMS/ED Course: Patient received Cardizem drip, magnesium and LR bolus. Medical admission has been requested for further management of atrial fibrillation with rapid ventricular response.  Review of Systems:  CONSTITUTIONAL: No fever/chills, positive fatigue, weakness, negative weight gain/loss, headache. EYES: No blurry or double vision. ENT: No tinnitus, postnasal drip, redness or soreness of the oropharynx. RESPIRATORY: No cough, dyspnea, wheeze.  No hemoptysis.  CARDIOVASCULAR: Positive palpitations.  No chest  pain, syncope, orthopnea. No lower extremity edema.  GASTROINTESTINAL: Positive chronic morning nausea, vomiting, negative  abdominal pain, diarrhea, constipation.  No hematemesis, melena or hematochezia. GENITOURINARY: No dysuria, frequency, hematuria. ENDOCRINE: No polyuria or nocturia. No heat or cold intolerance. HEMATOLOGY: No anemia, bruising, bleeding. INTEGUMENTARY: No rashes, ulcers, lesions. MUSCULOSKELETAL: No arthritis, gout NEUROLOGIC: No numbness, tingling, ataxia, seizure-type activity, weakness. PSYCHIATRIC: No anxiety, depression, insomnia.   Past Medical History:  Diagnosis Date  . Anemia   . Asthma   . Bacterial pneumonia 2014  . Beta thalassemia (HCC)   . Emphysema lung (HCC)   . GERD (gastroesophageal reflux disease)   . Sleep apnea    uses cpap    Past Surgical History:  Procedure Laterality Date  . APPENDECTOMY    . Bacterial Pneumonia Surgery     Pt unable to state what exactly was done, states "they went inside of removed it".  . COLONOSCOPY WITH PROPOFOL N/A 09/29/2018   Procedure: COLONOSCOPY WITH PROPOFOL;  Surgeon: Christena Deem, MD;  Location: [facility] ENDOSCOPY;  Service: Endoscopy;  Laterality: N/A;  . ESOPHAGOGASTRODUODENOSCOPY N/A 04/18/2019   Procedure: ESOPHAGOGASTRODUODENOSCOPY (EGD);  Surgeon: Toledo, Boykin Nearing, MD;  Location: [facility] ENDOSCOPY;  Service: Gastroenterology;  Laterality: N/A;  . ESOPHAGOGASTRODUODENOSCOPY (EGD) WITH PROPOFOL N/A 09/29/2018   Procedure: ESOPHAGOGASTRODUODENOSCOPY (EGD) WITH PROPOFOL;  Surgeon: Christena Deem, MD;  Location: [facility] ENDOSCOPY;  Service: Endoscopy;  Laterality: N/A;  . UMBILICAL HERNIA REPAIR N/A 01/05/2018   Procedure: HERNIA REPAIR UMBILICAL ADULT WITH MESH;  Surgeon: Sung Amabile, DO;  Location: [facility] ORS;  Service: General;  Laterality: N/A;  . UPPER GI ENDOSCOPY       reports that he has never  smoked. He has quit using smokeless tobacco. He reports current alcohol use of about 4.0 standard drinks  of alcohol per week. He reports that he does not use drugs.  No Known Allergies  Family History  Family history unknown: Yes  Father died at 61 of MI.  Mother and siblings are healthy  Prior to Admission medications   Medication Sig Start Date End Date Taking? Authorizing Provider  bumetanide (BUMEX) 1 MG tablet Take 1 mg by mouth 2 (two) times daily. 03/29/19   [provider]  cetirizine (ZYRTEC) 10 MG tablet Take 10 mg by mouth every morning.  12/12/17   [provider]  ferrous sulfate 325 (65 FE) MG EC tablet Take 325 mg by mouth daily. 01/13/19   [provider]  loratadine (CLARITIN) 10 MG tablet Take 10 mg by mouth every evening.    [provider]  metFORMIN (GLUCOPHAGE) 1000 MG tablet Take 1,000 mg by mouth 2 (two) times daily. 04/10/19   [provider]  metoprolol succinate (TOPROL-XL) 25 MG 24 hr tablet Take 25 mg by mouth daily. 04/10/19   [provider]  pantoprazole (PROTONIX) 40 MG tablet Take 1 tablet (40 mg total) by mouth 2 (two) times daily before a meal. 04/19/19   Danford, Earl Lites, MD  testosterone cypionate (DEPOTESTOSTERONE CYPIONATE) 200 MG/ML injection Inject 200 mg into the muscle every 14 (fourteen) days. 04/11/19   [provider]    Physical Exam: Vitals:   11/06/19 1950 11/06/19 2010 11/06/19 2015 11/06/19 2030  BP:  (!) 150/119  (!) 149/91  Pulse:  83 (!) 161 81  Resp:   (!) 21 (!) 25  Temp:      TempSrc:      SpO2:  99% 99%   Weight: 108.9 kg     Height: 5\' 8"  (1.727 m)       GENERAL: 49 y.o.-year-old male patient, well-developed, well-nourished lying in the bed in no acute distress.  Pleasant and cooperative.   HEENT: Head atraumatic, normocephalic. Pupils equal. Mucus membranes moist. NECK: Supple. No JVD. CHEST: Normal breath sounds bilaterally. No wheezing, rales, rhonchi or crackles. No use of accessory muscles of respiration.  No reproducible chest wall tenderness.   CARDIOVASCULAR: Irregularly irregular.  Tachycardic  No murmurs, rubs, or gallops. Cap refill <2 seconds. Pulses intact distally.  ABDOMEN: Soft, nondistended, nontender. No rebound, guarding, rigidity. Normoactive bowel sounds present in all four quadrants.  EXTREMITIES: No pedal edema, cyanosis, or clubbing. No calf tenderness or Homan's sign.  NEUROLOGIC: The patient is alert and oriented x 3. Cranial nerves II through XII are grossly intact with no focal sensorimotor deficit. PSYCHIATRIC:  Normal affect, mood, thought content. SKIN: Warm, dry, and intact without obvious rash, lesion, or ulcer.    Labs on Admission:  CBC: Recent Labs  Lab 11/06/19 1949  WBC 12.2*  NEUTROABS 9.0*  HGB 14.7  HCT 52.3*  MCV 70.9*  PLT 187   Basic Metabolic Panel: Recent Labs  Lab 11/06/19 1949  NA 134*  K 3.5  CL 97*  CO2 26  GLUCOSE 175*  BUN 6  CREATININE 0.98  CALCIUM 8.2*  MG 1.2*   GFR: Estimated Creatinine Clearance: 109.1 mL/min (by C-G formula based on SCr of 0.98 mg/dL). Liver Function Tests: Recent Labs  Lab 11/06/19 1949  AST 39  ALT 38  ALKPHOS 60  BILITOT 0.7  PROT 6.6  ALBUMIN 3.1*   No results for input(s): LIPASE, AMYLASE in the last 168 hours. No  results for input(s): AMMONIA in the last 168 hours. Coagulation Profile: No results for input(s): INR, PROTIME in the last 168 hours. Cardiac Enzymes: No results for input(s): CKTOTAL, CKMB, CKMBINDEX, TROPONINI in the last 168 hours. BNP (last 3 results) No results for input(s): PROBNP in the last 8760 hours. HbA1C: No results for input(s): HGBA1C in the last 72 hours. CBG: No results for input(s): GLUCAP in the last 168 hours. Lipid Profile: No results for input(s): CHOL, HDL, LDLCALC, TRIG, CHOLHDL, LDLDIRECT in the last 72 hours. Thyroid Function Tests: No results for input(s): TSH, T4TOTAL, FREET4, T3FREE, THYROIDAB in the last 72 hours. Anemia Panel: No results for input(s): VITAMINB12, FOLATE,  FERRITIN, TIBC, IRON, RETICCTPCT in the last 72 hours. Urine analysis:    Component Value Date/Time   COLORURINE YELLOW (A) 02/22/2018 0654   APPEARANCEUR CLEAR (A) 02/22/2018 0654   LABSPEC 1.032 (H) 02/22/2018 0654   PHURINE 5.0 02/22/2018 0654   GLUCOSEU NEGATIVE 02/22/2018 0654   HGBUR NEGATIVE 02/22/2018 0654   BILIRUBINUR NEGATIVE 02/22/2018 0654   KETONESUR NEGATIVE 02/22/2018 0654   PROTEINUR NEGATIVE 02/22/2018 0654   NITRITE NEGATIVE 02/22/2018 0654   LEUKOCYTESUR NEGATIVE 02/22/2018 0654   Sepsis Labs: @LABRCNTIP (procalcitonin:4,lacticidven:4) )No results found for this or any previous visit (from the past 240 hour(s)).   Radiological Exams on Admission: DG Chest Portable 1 View  Result Date: 11/06/2019 CLINICAL DATA:  Shortness of breath and generalized malaise. EXAM: PORTABLE CHEST 1 VIEW COMPARISON:  February 21, 2018 FINDINGS: Very mild, stable scarring and/or atelectasis is seen along the lateral aspect of the left lung base. A small, stable left pleural effusion versus pleural thickening is noted. There is no evidence of a pneumothorax. The heart size and mediastinal contours are within normal limits. The visualized skeletal structures are unremarkable. IMPRESSION: 1. Very mild, stable scarring and/or atelectasis along the lateral aspect of the left lung base. 2. Small, stable left pleural effusion versus pleural thickening. Electronically Signed   By: Virgina Norfolk M.D.   On: 11/06/2019 20:08    EKG: Atrial fibrillation at 167 bpm  Assessment/Plan  This is a 49 y.o. male with a history of alcohol use disorder with Mallory-Weiss tear, diabetes, COPD, GERD, sleep apnea, asthma, atrial fibrillation on one prior episode now being admitted with:  #.  Atrial fibrillation with rapid ventricular response -Admit inpatient telemetry -Cardizem drip to maintain heart rate less than 100 -Continue oral metoprolol -Add aspirin given CHA2DS2-VASc score of 1 -Check TSH,  lipid panel, urine drug screen, alcohol level -Check echo -Cardiology consult  #. Hypomagnesemia - Replaced in ER - Recheck level in AM  #.  History of alcohol use disorder -CIWA protocol -Banana bag -Considersocial work consultation regarding cessation  #. H/o Diabetes - Accuchecks achs with RISS coverage - Heart healthy, carb controlled diet -Hold Metformin  #.  History of GERD -Continue Protonix  #. History of allergies/asthma - Continue Zyrtec, Claritin  Admission status: Inpatient telemetry IV Fluids: Normal saline Diet/Nutrition: Healthy, carb controlled Consults called: Cardiology, please call in a.m. DVT Px: SCDs and early ambulation. Code Status: Full Code  Disposition Plan: To home in 1-2 days  All the records are reviewed and case discussed with ED provider. Management plans discussed with the patient and/or family who express understanding and agree with plan of care.  Waylynn Benefiel D.O. on 11/06/2019 at 9:24 PM CC: Primary care physician; [name], MD   11/06/2019, 9:24 PM

## 2019-11-06 NOTE — ED Provider Notes (Signed)
Labs reveal very slightly decreased sodium and chloride, receiving IV fluids.  Magnesium 1.2, will replete.  Initial troponin negative.  Patient remains on diltiazem drip with some modest improvement in his HR to the 110s. Will proceed w/ admission. Discussed w/ hospitalist.    Miguel Aschoff., MD 11/06/19 2119

## 2019-11-06 NOTE — ED Triage Notes (Signed)
Pt arrives [facility] from home with afib RVR. Pt works on trucks outside but reports feeling more sweaty all day today and not quite right. Pt states "I thought it was just the heat". Pt donates plasma was turned away this morning for being "clinically dehydrated" Pt has hx afib, denies blood thinner use. 90-91% room air, pt HR 150-180 18G R/L Ac Given 600 NS  Pt takes diltiazem and metoprolol

## 2019-11-07 ENCOUNTER — Inpatient Hospital Stay
Admit: 2019-11-07 | Discharge: 2019-11-07 | Disposition: A | Discharge status: Home / Self Care | Payer: BC Managed Care – PPO | Attending: Family Medicine | Admitting: Family Medicine

## 2019-11-07 DIAGNOSIS — I4891 Unspecified atrial fibrillation: Secondary | ICD-10-CM

## 2019-11-07 LAB — LIPID PANEL
Cholesterol: 186 mg/dL (ref 0–200)
HDL: 43 mg/dL (ref >40)
LDL Cholesterol: 103 mg/dL — ABNORMAL HIGH (ref 0–99)
Total CHOL/HDL Ratio: 4.3 RATIO
Triglycerides: 199 mg/dL — ABNORMAL HIGH (ref <150)
VLDL: 40 mg/dL (ref 0–40)

## 2019-11-07 LAB — CBC
HCT: 49.3 % (ref 39.0–52.0)
Hemoglobin: 13.9 g/dL (ref 13.0–17.0)
MCH: 20 pg — ABNORMAL LOW (ref 26.0–34.0)
MCHC: 28.2 g/dL — ABNORMAL LOW (ref 30.0–36.0)
MCV: 70.8 fL — ABNORMAL LOW (ref 80.0–100.0)
Platelets: 185 10*3/uL (ref 150–400)
RBC: 6.96 MIL/uL — ABNORMAL HIGH (ref 4.22–5.81)
RDW: 22.9 % — ABNORMAL HIGH (ref 11.5–15.5)
WBC: 10.9 10*3/uL — ABNORMAL HIGH (ref 4.0–10.5)
nRBC: 0 % (ref 0.0–0.2)

## 2019-11-07 LAB — GLUCOSE, CAPILLARY
Glucose-Capillary: 123 mg/dL — ABNORMAL HIGH (ref 70–99)
Glucose-Capillary: 133 mg/dL — ABNORMAL HIGH (ref 70–99)
Glucose-Capillary: 141 mg/dL — ABNORMAL HIGH (ref 70–99)
Glucose-Capillary: 157 mg/dL — ABNORMAL HIGH (ref 70–99)
Glucose-Capillary: 176 mg/dL — ABNORMAL HIGH (ref 70–99)

## 2019-11-07 LAB — BASIC METABOLIC PANEL
Anion gap: 13 (ref 5–15)
BUN: 7 mg/dL (ref 6–20)
CO2: 27 mmol/L (ref 22–32)
Calcium: 8.4 mg/dL — ABNORMAL LOW (ref 8.9–10.3)
Chloride: 94 mmol/L — ABNORMAL LOW (ref 98–111)
Creatinine, Ser: 0.83 mg/dL (ref 0.61–1.24)
GFR calc Af Amer: >60 mL/min (ref >60)
GFR calc non Af Amer: >60 mL/min (ref >60)
Glucose, Bld: 192 mg/dL — ABNORMAL HIGH (ref 70–99)
Potassium: 3.7 mmol/L (ref 3.5–5.1)
Sodium: 134 mmol/L — ABNORMAL LOW (ref 135–145)

## 2019-11-07 LAB — HEMOGLOBIN A1C
Hgb A1c MFr Bld: 7.1 % — ABNORMAL HIGH (ref 4.8–5.6)
Mean Plasma Glucose: 157.07 mg/dL

## 2019-11-07 LAB — ECHOCARDIOGRAM COMPLETE
Height: 67 in
Weight: 4084.8 oz

## 2019-11-07 LAB — MAGNESIUM: Magnesium: 1.8 mg/dL (ref 1.7–2.4)

## 2019-11-07 MED ORDER — MAGNESIUM SULFATE 2 GM/50ML IV SOLN
2.0000 g | Freq: Once | INTRAVENOUS | Status: AC
  Administered 2019-11-07: 2 g via INTRAVENOUS
  Filled 2019-11-07: qty 50

## 2019-11-07 MED ORDER — DILTIAZEM HCL 30 MG PO TABS
60.0000 mg | ORAL_TABLET | Freq: Three times a day (TID) | ORAL | Status: DC
  Administered 2019-11-07: 60 mg via ORAL
  Filled 2019-11-07: qty 2

## 2019-11-07 MED ORDER — DILTIAZEM HCL 30 MG PO TABS
30.0000 mg | ORAL_TABLET | Freq: Four times a day (QID) | ORAL | Status: DC
  Administered 2019-11-07 – 2019-11-08 (×2): 30 mg via ORAL
  Filled 2019-11-07 (×2): qty 1

## 2019-11-07 MED ORDER — MOMETASONE FURO-FORMOTEROL FUM 200-5 MCG/ACT IN AERO
2.0000 | INHALATION_SPRAY | Freq: Two times a day (BID) | RESPIRATORY_TRACT | Status: DC
  Administered 2019-11-07 – 2019-11-08 (×2): 2 via RESPIRATORY_TRACT
  Filled 2019-11-07: qty 8.8

## 2019-11-07 MED ORDER — PERFLUTREN LIPID MICROSPHERE
1.0000 mL | INTRAVENOUS | Status: AC | PRN
  Administered 2019-11-07: 2 mL via INTRAVENOUS
  Filled 2019-11-07: qty 10

## 2019-11-07 MED ORDER — ALBUTEROL SULFATE (2.5 MG/3ML) 0.083% IN NEBU: 2.5000 mg | INHALATION_SOLUTION | RESPIRATORY_TRACT | Status: DC | PRN

## 2019-11-07 MED ORDER — FERROUS SULFATE 325 (65 FE) MG PO TABS
325.0000 mg | ORAL_TABLET | Freq: Every day | ORAL | Status: DC
  Administered 2019-11-07 – 2019-11-08 (×2): 325 mg via ORAL
  Filled 2019-11-07 (×2): qty 1

## 2019-11-07 MED ORDER — MAGNESIUM SULFATE 2 GM/50ML IV SOLN: 1.0000 g | Freq: Once | INTRAVENOUS | Status: DC

## 2019-11-07 MED ORDER — METOPROLOL SUCCINATE ER 50 MG PO TB24
50.0000 mg | ORAL_TABLET | Freq: Every day | ORAL | Status: DC
  Administered 2019-11-07 – 2019-11-08 (×2): 50 mg via ORAL
  Filled 2019-11-07 (×2): qty 1

## 2019-11-07 MED ORDER — LISINOPRIL 10 MG PO TABS
10.0000 mg | ORAL_TABLET | Freq: Every day | ORAL | Status: DC
  Administered 2019-11-07 – 2019-11-08 (×2): 10 mg via ORAL
  Filled 2019-11-07 (×2): qty 1

## 2019-11-07 MED ORDER — BUMETANIDE 1 MG PO TABS
1.0000 mg | ORAL_TABLET | Freq: Two times a day (BID) | ORAL | Status: DC
  Administered 2019-11-07 – 2019-11-08 (×3): 1 mg via ORAL
  Filled 2019-11-07 (×4): qty 1

## 2019-11-07 MED ORDER — ALBUTEROL SULFATE HFA 108 (90 BASE) MCG/ACT IN AERS: 2.0000 | INHALATION_SPRAY | RESPIRATORY_TRACT | Status: DC | PRN

## 2019-11-07 NOTE — Progress Notes (Signed)
PROGRESS NOTE    Evan Edwards  ZHY:[id number] DOB: 1971-04-10 DOA: 11/06/2019 PCP: Marcelino Duster, MD    Chief Complaint  Patient presents with  . A fib RVR    Brief Narrative:  49 year old with history of alcohol use disorder with Mallory-Weiss tear, diabetes mellitus type 2, COPD, GERD, sleep apnea on CPAP, asthma and history of A. fib in 2014 presented with diaphoresis, palpitation with findings of A. fib with RVR.  Patient drinks about 6 beers per day and reports having occasional tremors. Patient admitted on IV Cardizem drip.  Assessment & Plan:   Active Problems:   Atrial fibrillation with RVR (HCC) Likely contributed by underlying alcohol abuse.  On Cardizem drip, wean to discontinue this afternoon.  CHA2DS2-VASc score of 1.  Pending 2D echo.  Needs to be on aspirin (patient agrees for it).  TSH normal. Resumed home dose Toprol XL and increased dose of 50 mg daily (was on 25 mg daily).  Will start on oral Cardizem 30 mg every 6 hours and switch to long-acting dose in the morning. Continue telemetry monitoring.  Active problems Hypomagnesemia Replenished  Alcohol abuse with history of Mallory-Weiss tear Drinks 6 beers per day.  Reports occasional withdrawal symptoms.  Monitor on CIWA.  Add thiamine, folate and multivitamin.  Morbid obesity (BMI 40 kg/m) Counseled on weight loss in excess.  OSA On CPAP at home  Diabetes mellitus type 2, controlled A1c of 7.1.  On Metformin at home.  Monitor on sliding scale coverage.  Iron deficiency anemia Continue supplement.  GERD Continue PPI  COPD Stable.  Continue home inhaler.  DVT prophylaxis: Subcu Lovenox Code Status: Full code Family Communication: None at bedside Disposition:   Status is: Inpatient  Remains inpatient appropriate because:Hemodynamically unstable.  Home in a.m. if heart rate stable on regimen   Dispo: The patient is from: Home              Anticipated d/c is to: Home               Anticipated d/c date is: 1 day              Patient currently is not medically stable to d/c.       Consultants:  None  Procedures: 2D echo  Antimicrobials: None  Subjective: Seen and examined denies  palpitation, chest pain or diaphoresis today.   Objective: Vitals:   11/07/19 0700 11/07/19 0730 11/07/19 0800 11/07/19 0900  BP: 140/78 (!) 147/77 (!) 166/92 (!) 156/90  Pulse: 79 76 69 79  Resp: 18 16 19 20   Temp:  98.5 F (36.9 C)  98.3 F (36.8 C)  TempSrc:  Oral  Oral  SpO2: 93% 96% 95% 95%  Weight:    115.8 kg  Height:    5\' 7"  (1.702 m)    Intake/Output Summary (Last 24 hours) at 11/07/2019 0931 Last data filed at 11/06/2019 2239 Gross per 24 hour  Intake 1050 ml  Output --  Net 1050 ml   Filed Weights   11/06/19 1950 11/07/19 0900  Weight: 108.9 kg 115.8 kg    Examination:  General exam: middle aged obese male in NAD  HEENT: moist mucosa, supple neck  chest : clear b/l  CVS: NS1&S2, no murmurs GI: soft, NT, ND,  musculoskeletal: warm, no edema  CNS; alert and oriented. No tremors    Data Reviewed: I have personally reviewed following labs and imaging studies  CBC: Recent Labs  Lab 11/06/19 1949 11/07/19 01/06/20  WBC 12.2* 10.9*  NEUTROABS 9.0*  --   HGB 14.7 13.9  HCT 52.3* 49.3  MCV 70.9* 70.8*  PLT 187 191    Basic Metabolic Panel: Recent Labs  Lab 11/06/19 1949 11/07/19 0648  NA 134* 134*  K 3.5 3.7  CL 97* 94*  CO2 26 27  GLUCOSE 175* 192*  BUN 6 7  CREATININE 0.98 0.83  CALCIUM 8.2* 8.4*  MG 1.2*  --     GFR: Estimated Creatinine Clearance: 131 mL/min (by C-G formula based on SCr of 0.83 mg/dL).  Liver Function Tests: Recent Labs  Lab 11/06/19 1949  AST 39  ALT 38  ALKPHOS 60  BILITOT 0.7  PROT 6.6  ALBUMIN 3.1*    CBG: Recent Labs  Lab 11/07/19 0026 11/07/19 0820  GLUCAP 133* 176*     Recent Results (from the past 240 hour(s))  SARS Coronavirus 2 by RT PCR (hospital order, performed in [facility]  hospital lab) Nasopharyngeal Nasopharyngeal Swab     Status: None   Collection Time: 11/06/19  7:49 PM   Specimen: Nasopharyngeal Swab  Result Value Ref Range Status   SARS Coronavirus 2 NEGATIVE NEGATIVE Final    Comment: (NOTE) SARS-CoV-2 target nucleic acids are NOT DETECTED. The SARS-CoV-2 RNA is generally detectable in upper and lower respiratory specimens during the acute phase of infection. The lowest concentration of SARS-CoV-2 viral copies this assay can detect is 250 copies / mL. A negative result does not preclude SARS-CoV-2 infection and should not be used as the sole basis for treatment or other patient management decisions.  A negative result may occur with improper specimen collection / handling, submission of specimen other than nasopharyngeal swab, presence of viral mutation(s) within the areas targeted by this assay, and inadequate number of viral copies (<250 copies / mL). A negative result must be combined with clinical observations, patient history, and epidemiological information. Fact Sheet for Patients:   StrictlyIdeas.no Fact Sheet for Healthcare Providers: BankingDealers.co.za This test is not yet approved or cleared  by the [place] FDA and has been authorized for detection and/or diagnosis of SARS-CoV-2 by FDA under an Emergency Use Authorization (EUA).  This EUA will remain in effect (meaning this test can be used) for the duration of the COVID-19 declaration under Section 564(b)(1) of the Act, 21 U.S.C. section 360bbb-3(b)(1), unless the authorization is terminated or revoked sooner. Performed at [facility], [address], [place], [place] [address]          Radiology Studies: DG Chest Portable 1 View  Result Date: 11/06/2019 CLINICAL DATA:  Shortness of breath and generalized malaise. EXAM: PORTABLE CHEST 1 VIEW COMPARISON:  February 21, 2018 FINDINGS: Very mild, stable scarring  and/or atelectasis is seen along the lateral aspect of the left lung base. A small, stable left pleural effusion versus pleural thickening is noted. There is no evidence of a pneumothorax. The heart size and mediastinal contours are within normal limits. The visualized skeletal structures are unremarkable. IMPRESSION: 1. Very mild, stable scarring and/or atelectasis along the lateral aspect of the left lung base. 2. Small, stable left pleural effusion versus pleural thickening. Electronically Signed   By: Virgina Norfolk M.D.   On: 11/06/2019 20:08        Scheduled Meds: . aspirin EC  81 mg Oral Daily  . folic acid  1 mg Oral Daily  . insulin aspart  0-15 Units Subcutaneous TID WC  . insulin aspart  0-5 Units Subcutaneous QHS  .  LORazepam  0-4 mg Oral Q6H   Followed by  . [START ON 11/08/2019] LORazepam  0-4 mg Oral Q12H  . multivitamin with minerals  1 tablet Oral Daily  . thiamine  100 mg Oral Daily   Or  . thiamine  100 mg Intravenous Daily   Continuous Infusions: . diltiazem (CARDIZEM) infusion 7.5 mg/hr (11/07/19 0744)     LOS: 1 day    Time spent: 35 minutes    Dominic Rhome, MD [facility]   To contact the attending provider between 7A-7P or the covering provider during after hours 7P-7A, please log into the web site [email or web address] and access using universal [place] password for that web site. If you do not have the password, please call the hospital operator.  11/07/2019, 9:31 AM

## 2019-11-07 NOTE — Progress Notes (Signed)
Cardizem drip rate decreased to 76ml/hr.  Spoke to [name] and we will attempt to stop the drip and change to PO.  Patient in sinus at this time with heart rate in the 70-80s. Drip stopped and Dr. Gonzella Lex made aware.Evan Edwards

## 2019-11-07 NOTE — ED Notes (Signed)
Patient given crackers and cola 

## 2019-11-07 NOTE — Progress Notes (Signed)
*  PRELIMINARY RESULTS* Echocardiogram 2D Echocardiogram has been performed.  Joanette Gula Henson Fraticelli 11/07/2019, 10:22 AM

## 2019-11-08 DIAGNOSIS — I10 Essential (primary) hypertension: Secondary | ICD-10-CM | POA: Diagnosis present

## 2019-11-08 DIAGNOSIS — E1165 Type 2 diabetes mellitus with hyperglycemia: Secondary | ICD-10-CM

## 2019-11-08 LAB — GLUCOSE, CAPILLARY: Glucose-Capillary: 173 mg/dL — ABNORMAL HIGH (ref 70–99)

## 2019-11-08 MED ORDER — ASPIRIN 81 MG PO TBEC: 81.0000 mg | DELAYED_RELEASE_TABLET | Freq: Every day | ORAL | 3 refills | Status: AC

## 2019-11-08 MED ORDER — DILTIAZEM HCL ER COATED BEADS 120 MG PO CP24: 120.0000 mg | ORAL_CAPSULE | Freq: Every day | ORAL | 3 refills | Status: AC

## 2019-11-08 MED ORDER — METOPROLOL SUCCINATE ER 25 MG PO TB24: 50.0000 mg | ORAL_TABLET | Freq: Every day | ORAL | 0 refills | Status: AC

## 2019-11-08 NOTE — Discharge Summary (Addendum)
Physician Discharge Summary  Evan Edwards QMV:[id number] DOB: December 19, 1970 DOA: 11/06/2019  PCP: Marcelino Duster, MD  Admit date: 11/06/2019 Discharge date: 11/08/2019  Admitted From: Home Disposition: Home  Recommendations for Outpatient Follow-up:  1. Follow up with PCP in 1-2 weeks   Home Health: None Equipment/Devices: None  Discharge Condition: Fair CODE STATUS: Full code Diet recommendation: Heart Healthy / Carb Modified   Discharge Diagnoses:  Principal problem   Atrial fibrillation with RVR (HCC)   Active Problems:   Alcohol abuse   Controlled type 2 diabetes mellitus with hyperglycemia (HCC)   Obesity, Class III, BMI 40-49.9 (morbid obesity) (HCC)   Essential hypertension  Brief narrative/HPI 49 year old with history of alcohol use disorder with Mallory-Weiss tear, diabetes mellitus type 2,  GERD, sleep apnea on CPAP, asthma and history of A. fib in 2014 presented with diaphoresis, palpitation with findings of A. fib with RVR.  Patient drinks about 6 beers per day and reports having occasional tremors. Patient admitted on IV Cardizem drip.   Hospital course   Active Problems:   Atrial fibrillation with RVR (HCC) New onset. Likely contributed by underlying alcohol abuse and hypertension.  CHA2DS2-VASc score of 2 (hypertension and diabetes).  2D echo with normal EF and no wall motion abnormality.  TSH was normal. Patient started on aspirin 81 mg daily.  (Refuses to be on anticoagulation). Heart rate improved on IV Cardizem drip, tapered to discontinue.  Started on oral Cardizem 30 mg every 6 hours and increased Toprol dose to 50 mg daily with good rate control. I will discharge him on Toprol 50 mg daily and Cardizem CD 120 mg daily.  Follow-up with PCP as outpatient.  Active problems Hypomagnesemia Replenished  Essential hypertension Continue lisinopril and Bumex.  Increased Toprol dose and added Cardizem for rate control.  Alcohol abuse with history  of Mallory-Weiss tear Drinks 6 beers per day.  Reports occasional withdrawal symptoms.    No signs of withdrawal.  Counseled strongly on cessation.  Continue PPI.  Morbid obesity (BMI 40 kg/m) Counseled on weight loss and exercise to lose weight.  OSA On CPAP at home  Diabetes mellitus type 2, controlled A1c of 7.1.  On Metformin at home.  CBG stable.  Iron deficiency anemia Continue supplement.  GERD Continue PPI  History of asthma/?  COPD Stable.  Continue home inhaler.  Procedure: 2D echo Consult: None Disposition: Home  Discharge Instructions   Allergies as of 11/08/2019   No Known Allergies     Medication List    TAKE these medications   albuterol 108 (90 Base) MCG/ACT inhaler Commonly known as: VENTOLIN HFA Inhale 2 puffs into the lungs every 4 (four) hours as needed.   aspirin 81 MG EC tablet Take 1 tablet (81 mg total) by mouth daily.   bumetanide 1 MG tablet Commonly known as: BUMEX Take 1 mg by mouth 2 (two) times daily.   cetirizine 10 MG tablet Commonly known as: ZYRTEC Take 10 mg by mouth every morning.   diltiazem 120 MG 24 hr capsule Commonly known as: Cardizem CD Take 1 capsule (120 mg total) by mouth daily.   ferrous sulfate 325 (65 FE) MG EC tablet Take 325 mg by mouth daily.   lisinopril 10 MG tablet Commonly known as: ZESTRIL Take 10 mg by mouth daily.   loratadine 10 MG tablet Commonly known as: CLARITIN Take 10 mg by mouth every evening.   metFORMIN 1000 MG tablet Commonly known as: GLUCOPHAGE Take 1,000 mg by mouth 2 (  two) times daily.   metoprolol succinate 25 MG 24 hr tablet Commonly known as: TOPROL-XL Take 2 tablets (50 mg total) by mouth daily. What changed: how much to take   pantoprazole 40 MG tablet Commonly known as: PROTONIX Take 1 tablet (40 mg total) by mouth 2 (two) times daily before a meal.   Symbicort 160-4.5 MCG/ACT inhaler Generic drug: budesonide-formoterol Inhale 2 puffs into the lungs 2  (two) times daily.   testosterone cypionate 200 MG/ML injection Commonly known as: DEPOTESTOSTERONE CYPIONATE Inject 200 mg into the muscle every 14 (fourteen) days.       Follow-up Information    Marcelino Duster, MD Follow up in 2 week(s).   Specialty: Internal Medicine Contact information: [address] [place] [address] [phone]              No Known Allergies      Procedures/Studies: DG Chest Portable 1 View  Result Date: 11/06/2019 CLINICAL DATA:  Shortness of breath and generalized malaise. EXAM: PORTABLE CHEST 1 VIEW COMPARISON:  February 21, 2018 FINDINGS: Very mild, stable scarring and/or atelectasis is seen along the lateral aspect of the left lung base. A small, stable left pleural effusion versus pleural thickening is noted. There is no evidence of a pneumothorax. The heart size and mediastinal contours are within normal limits. The visualized skeletal structures are unremarkable. IMPRESSION: 1. Very mild, stable scarring and/or atelectasis along the lateral aspect of the left lung base. 2. Small, stable left pleural effusion versus pleural thickening. Electronically Signed   By: Aram Candela M.D.   On: 11/06/2019 20:08   ECHOCARDIOGRAM COMPLETE  Result Date: 11/07/2019    ECHOCARDIOGRAM REPORT   Patient Name:   Evan Edwards Date of Exam: 11/07/2019 Medical Rec #:  [id number]            Height:       67.0 in Accession #:    [id number]           Weight:       255.3 lb Date of Birth:  June 07, 1970            BSA:          2.243 m Patient Age:    48 years             BP:           156/90 mmHg Patient Gender: M                    HR:           84 bpm. Exam Location:  [facility] Procedure: 2D Echo, Limited Color Doppler, Cardiac Doppler and Intracardiac            Opacification Agent Indications:     I48.91 Atrial Fibrillation  History:         Patient has prior history of Echocardiogram examinations. Risk                  Factors:Sleep Apnea. Lung emphysema.   Sonographer:     Humphrey Rolls RDCS (AE) Referring Phys:  [id number] Tonye Royalty Diagnosing Phys: Arnoldo Hooker MD  Sonographer Comments: Technically difficult study due to poor echo windows. TDS due to body habitus and lung emphysema. IMPRESSIONS  1. Left ventricular ejection fraction, by estimation, is 60 to 65%. The left ventricle has normal function. The left ventricle has no regional wall motion abnormalities. Left ventricular diastolic parameters were normal.  2. Right ventricular systolic function is  normal. The right ventricular size is normal.  3. The mitral valve is normal in structure. Trivial mitral valve regurgitation.  4. The aortic valve is normal in structure. Aortic valve regurgitation is not visualized. FINDINGS  Left Ventricle: Left ventricular ejection fraction, by estimation, is 60 to 65%. The left ventricle has normal function. The left ventricle has no regional wall motion abnormalities. Definity contrast agent was given IV to delineate the left ventricular  endocardial borders. The left ventricular internal cavity size was normal in size. There is no left ventricular hypertrophy. Left ventricular diastolic parameters were normal. Right Ventricle: The right ventricular size is normal. No increase in right ventricular wall thickness. Right ventricular systolic function is normal. Left Atrium: Left atrial size was normal in size. Right Atrium: Right atrial size was normal in size. Pericardium: There is no evidence of pericardial effusion. Mitral Valve: The mitral valve is normal in structure. Trivial mitral valve regurgitation. MV peak gradient, 2.5 mmHg. The mean mitral valve gradient is 1.0 mmHg. Tricuspid Valve: The tricuspid valve is normal in structure. Tricuspid valve regurgitation is trivial. Aortic Valve: The aortic valve is normal in structure. Aortic valve regurgitation is not visualized. Aortic valve mean gradient measures 6.0 mmHg. Aortic valve peak gradient measures 11.0 mmHg.  Aortic valve area, by VTI measures 2.78 cm. Pulmonic Valve: The pulmonic valve was normal in structure. Pulmonic valve regurgitation is not visualized. Aorta: The aortic root and ascending aorta are structurally normal, with no evidence of dilitation. IAS/Shunts: No atrial level shunt detected by color flow Doppler.  LEFT VENTRICLE PLAX 2D LVIDd:         4.44 cm  Diastology LVIDs:         2.45 cm  LV e' lateral:   11.00 cm/s LV PW:         0.98 cm  LV E/e' lateral: 7.6 LV IVS:        0.72 cm  LV e' medial:    7.18 cm/s LVOT diam:     2.40 cm  LV E/e' medial:  11.6 LV SV:         81 LV SV Index:   36 LVOT Area:     4.52 cm  LEFT ATRIUM             Index LA diam:        3.40 cm 1.52 cm/m LA Vol (A2C):   90.9 ml 40.52 ml/m LA Vol (A4C):   63.2 ml 28.17 ml/m LA Biplane Vol: 80.9 ml 36.06 ml/m  AORTIC VALVE                    PULMONIC VALVE AV Area (Vmax):    3.84 cm     PV Vmax:       1.66 m/s AV Area (Vmean):   3.32 cm     PV Vmean:      110.000 cm/s AV Area (VTI):     2.78 cm     PV VTI:        0.246 m AV Vmax:           166.00 cm/s  PV Peak grad:  11.0 mmHg AV Vmean:          118.000 cm/s PV Mean grad:  6.0 mmHg AV VTI:            0.290 m AV Peak Grad:      11.0 mmHg AV Mean Grad:      6.0 mmHg LVOT Vmax:  141.00 cm/s LVOT Vmean:        86.600 cm/s LVOT VTI:          0.178 m LVOT/AV VTI ratio: 0.61  AORTA Ao Root diam: 3.30 cm MITRAL VALVE MV Area (PHT): 4.21 cm    SHUNTS MV Peak grad:  2.5 mmHg    Systemic VTI:  0.18 m MV Mean grad:  1.0 mmHg    Systemic Diam: 2.40 cm MV Vmax:       0.79 m/s MV Vmean:      49.3 cm/s MV Decel Time: 180 msec MV E velocity: 83.10 cm/s MV A velocity: 66.40 cm/s MV E/A ratio:  1.25 Serafina Royals MD Electronically signed by Serafina Royals MD Signature Date/Time: 11/07/2019/6:00:38 PM    Final        Subjective: And and examined.  Heart rate stable on the monitor and in sinus rhythm throughout.  Denies any palpitations or tremors.  Discharge Exam: Vitals:    11/08/19 0500 11/08/19 0728  BP: 137/89 127/85  Pulse: 76 72  Resp:  (!) 24  Temp: 97.9 F (36.6 C) 98.1 F (36.7 C)  SpO2: 95% 93%   Vitals:   11/07/19 1931 11/08/19 0338 11/08/19 0500 11/08/19 0728  BP: (!) 148/84  137/89 127/85  Pulse: 73  76 72  Resp: 18   (!) 24  Temp: 98.1 F (36.7 C)  97.9 F (36.6 C) 98.1 F (36.7 C)  TempSrc: Oral  Oral Oral  SpO2: 94%  95% 93%  Weight:  114.5 kg    Height:        General: Middle-aged obese male not in distress HEENT: Moist mucosa, supple neck Chest: Clear bilaterally CVs: Normal S1-S2, no murmur rub or gallop GI: Soft, nondistended, nontender Musculoskeletal: Warm, no edema CNS: Alert and oriented, no tremors    The results of significant diagnostics from this hospitalization (including imaging, microbiology, ancillary and laboratory) are listed below for reference.     Microbiology: Recent Results (from the past 240 hour(s))  SARS Coronavirus 2 by RT PCR (hospital order, performed in [facility] hospital lab) Nasopharyngeal Nasopharyngeal Swab     Status: None   Collection Time: 11/06/19  7:49 PM   Specimen: Nasopharyngeal Swab  Result Value Ref Range Status   SARS Coronavirus 2 NEGATIVE NEGATIVE Final    Comment: (NOTE) SARS-CoV-2 target nucleic acids are NOT DETECTED. The SARS-CoV-2 RNA is generally detectable in upper and lower respiratory specimens during the acute phase of infection. The lowest concentration of SARS-CoV-2 viral copies this assay can detect is 250 copies / mL. A negative result does not preclude SARS-CoV-2 infection and should not be used as the sole basis for treatment or other patient management decisions.  A negative result may occur with improper specimen collection / handling, submission of specimen other than nasopharyngeal swab, presence of viral mutation(s) within the areas targeted by this assay, and inadequate number of viral copies (<250 copies / mL). A negative result must be combined  with clinical observations, patient history, and epidemiological information. Fact Sheet for Patients:   StrictlyIdeas.no Fact Sheet for Healthcare Providers: BankingDealers.co.za This test is not yet approved or cleared  by the [place] FDA and has been authorized for detection and/or diagnosis of SARS-CoV-2 by FDA under an Emergency Use Authorization (EUA).  This EUA will remain in effect (meaning this test can be used) for the duration of the COVID-19 declaration under Section 564(b)(1) of the Act, 21 U.S.C. section 360bbb-3(b)(1), unless the authorization is  terminated or revoked sooner. Performed at [facility], [address] [place], [place] [address]      Labs: BNP (last 3 results) No results for input(s): BNP in the last 8760 hours. Basic Metabolic Panel: Recent Labs  Lab 11/06/19 1949 11/07/19 0648  NA 134* 134*  K 3.5 3.7  CL 97* 94*  CO2 26 27  GLUCOSE 175* 192*  BUN 6 7  CREATININE 0.98 0.83  CALCIUM 8.2* 8.4*  MG 1.2* 1.8   Liver Function Tests: Recent Labs  Lab 11/06/19 1949  AST 39  ALT 38  ALKPHOS 60  BILITOT 0.7  PROT 6.6  ALBUMIN 3.1*   No results for input(s): LIPASE, AMYLASE in the last 168 hours. No results for input(s): AMMONIA in the last 168 hours. CBC: Recent Labs  Lab 11/06/19 1949 11/07/19 0648  WBC 12.2* 10.9*  NEUTROABS 9.0*  --   HGB 14.7 13.9  HCT 52.3* 49.3  MCV 70.9* 70.8*  PLT 187 185   Cardiac Enzymes: No results for input(s): CKTOTAL, CKMB, CKMBINDEX, TROPONINI in the last 168 hours. BNP: Invalid input(s): POCBNP CBG: Recent Labs  Lab 11/07/19 0820 11/07/19 1158 11/07/19 1701 11/07/19 2146 11/08/19 0729  GLUCAP 176* 157* 123* 141* 173*   D-Dimer No results for input(s): DDIMER in the last 72 hours. Hgb A1c Recent Labs    11/06/19 1949  HGBA1C 7.1*   Lipid Profile Recent Labs    11/07/19 0648  CHOL 186  HDL 43  LDLCALC 103*  TRIG  199*  CHOLHDL 4.3   Thyroid function studies Recent Labs    11/06/19 1949  TSH 3.239   Anemia work up No results for input(s): VITAMINB12, FOLATE, FERRITIN, TIBC, IRON, RETICCTPCT in the last 72 hours. Urinalysis    Component Value Date/Time   COLORURINE YELLOW (A) 02/22/2018 0654   APPEARANCEUR CLEAR (A) 02/22/2018 0654   LABSPEC 1.032 (H) 02/22/2018 0654   PHURINE 5.0 02/22/2018 0654   GLUCOSEU NEGATIVE 02/22/2018 0654   HGBUR NEGATIVE 02/22/2018 0654   BILIRUBINUR NEGATIVE 02/22/2018 0654   KETONESUR NEGATIVE 02/22/2018 0654   PROTEINUR NEGATIVE 02/22/2018 0654   NITRITE NEGATIVE 02/22/2018 0654   LEUKOCYTESUR NEGATIVE 02/22/2018 0654   Sepsis Labs Invalid input(s): PROCALCITONIN,  WBC,  LACTICIDVEN Microbiology Recent Results (from the past 240 hour(s))  SARS Coronavirus 2 by RT PCR (hospital order, performed in [facility] hospital lab) Nasopharyngeal Nasopharyngeal Swab     Status: None   Collection Time: 11/06/19  7:49 PM   Specimen: Nasopharyngeal Swab  Result Value Ref Range Status   SARS Coronavirus 2 NEGATIVE NEGATIVE Final    Comment: (NOTE) SARS-CoV-2 target nucleic acids are NOT DETECTED. The SARS-CoV-2 RNA is generally detectable in upper and lower respiratory specimens during the acute phase of infection. The lowest concentration of SARS-CoV-2 viral copies this assay can detect is 250 copies / mL. A negative result does not preclude SARS-CoV-2 infection and should not be used as the sole basis for treatment or other patient management decisions.  A negative result may occur with improper specimen collection / handling, submission of specimen other than nasopharyngeal swab, presence of viral mutation(s) within the areas targeted by this assay, and inadequate number of viral copies (<250 copies / mL). A negative result must be combined with clinical observations, patient history, and epidemiological information. Fact Sheet for Patients:    BoilerBrush.com.cy Fact Sheet for Healthcare Providers: https://pope.com/ This test is not yet approved or cleared  by the [place] FDA and has  been authorized for detection and/or diagnosis of SARS-CoV-2 by FDA under an Emergency Use Authorization (EUA).  This EUA will remain in effect (meaning this test can be used) for the duration of the COVID-19 declaration under Section 564(b)(1) of the Act, 21 U.S.C. section 360bbb-3(b)(1), unless the authorization is terminated or revoked sooner. Performed at [facility], [address] [place], [place] [id number]      Time coordinating discharge: 35 minutes  SIGNED:   Eddie NorthNishant Kaylum Shrum, MD  [facility] 11/08/2019, 9:42 AM Pager   If 7PM-7AM, please contact night-coverage [email or web address] Password [email or web address]

## 2019-11-08 NOTE — Discharge Instructions (Signed)
Alcohol Abuse and Dependence Information, Adult Alcohol is a widely available drug. People drink alcohol in different amounts. People who drink alcohol very often and in large amounts often have problems during and after drinking. They may develop what is called an alcohol use disorder. There are two main types of alcohol use disorders:  Alcohol abuse. This is when you use alcohol too much or too often. You may use alcohol to make yourself feel happy or to reduce stress. You may have a hard time setting a limit on the amount you drink.  Alcohol dependence. This is when you use alcohol consistently for a period of time, and your body changes as a result. This can make it hard to stop drinking because you may start to feel sick or feel different when you do not use alcohol. These symptoms are known as withdrawal. How can alcohol abuse and dependence affect me? Alcohol abuse and dependence can have a negative effect on your life. Drinking too much can lead to addiction. You may feel like you need alcohol to function normally. You may drink alcohol before work in the morning, during the day, or as soon as you get home from work in the evening. These actions can result in:  Poor work performance.  Job loss.  Financial problems.  Car crashes or criminal charges from driving after drinking alcohol.  Problems in your relationships with friends and family.  Losing the trust and respect of coworkers, friends, and family. Drinking heavily over a long period of time can permanently damage your body and brain, and can cause lifelong health issues, such as:  Damage to your liver or pancreas.  Heart problems, high blood pressure, or stroke.  Certain cancers.  Decreased ability to fight infections.  Brain or nerve damage.  Depression.  Early (premature) death. If you are careless or you crave alcohol, it is easy to drink more than your body can handle (overdose). Alcohol overdose is a serious  situation that requires hospitalization. It may lead to permanent injuries or death. What can increase my risk?  Having a family history of alcohol abuse.  Having depression or other mental health conditions.  Beginning to drink at an early age.  Binge drinking often.  Experiencing trauma, stress, and an unstable home life during childhood.  Spending time with people who drink often. What actions can I take to prevent or manage alcohol abuse and dependence?  Do not drink alcohol if: ? Your health care provider tells you not to drink. ? You are pregnant, may be pregnant, or are planning to become pregnant.  If you drink alcohol: ? Limit how much you use to:  0-1 drink a day for women.  0-2 drinks a day for men. ? Be aware of how much alcohol is in your drink. In the [place] one drink equals one 12 oz bottle of beer (355 mL), one 5 oz glass of wine (148 mL), or one 1 oz glass of hard liquor (44 mL).  Stop drinking if you have been drinking too much. This can be very hard to do if you are used to abusing alcohol. If you begin to have withdrawal symptoms, talk with your health care provider or a person that you trust. These symptoms may include anxiety, shaky hands, headache, nausea, sweating, or not being able to sleep.  Choose to drink nonalcoholic beverages in social gatherings and places where there may be alcohol. Activity  Spend more time on activities that you enjoy that do  not involve alcohol, like hobbies or exercise.  Find healthy ways to cope with stress, such as exercise, meditation, or spending time with people you care about. General information  Talk to your family, coworkers, and friends about supporting you in your efforts to stop drinking. If they drink, ask them not to drink around you. Spend more time with people who do not drink alcohol.  If you think that you have an alcohol dependency problem: ? Tell friends or family about your concerns. ? Talk with your  health care provider or another health professional about where to get help. ? Work with a Paramedic and a Network engineer. ? Consider joining a support group for people who struggle with alcohol abuse and dependence. Where to find support   Your health care provider.  SMART Recovery: www.smartrecovery.org Therapy and support groups  Local treatment centers or chemical dependency counselors.  Local AA groups in your community: SalaryStart.tn Where to find more information  Centers for Disease Control and Prevention: FootballExhibition.com.br  [employer] on Alcohol Abuse and Alcoholism: BasicStudents.dk  Alcoholics Anonymous (AA): SalaryStart.tn Contact a health care provider if:  You drank more or for longer than you intended on more than one occasion.  You tried to stop drinking or to cut back on how much you drink, but you were not able to.  You often drink to the point of vomiting or passing out.  You want to drink so badly that you cannot think about anything else.  You have problems in your life due to drinking, but you continue to drink.  You keep drinking even though you feel anxious, depressed, or have experienced memory loss.  You have stopped doing the things you used to enjoy in order to drink.  You have to drink more than you used to in order to get the effect you want.  You experience anxiety, sweating, nausea, shakiness, and trouble sleeping when you try to stop drinking. Get help right away if:  You have thoughts about hurting yourself or others.  You have serious withdrawal symptoms, including: ? Confusion. ? Racing heart. ? High blood pressure. ? Fever. If you ever feel like you may hurt yourself or others, or have thoughts about taking your own life, get help right away. You can go to your nearest emergency department or call:  Your local emergency services (911 in the [place]  A suicide crisis helpline, such as the National Suicide Prevention  Lifeline at 725-113-1714. This is open 24 hours a day. Summary  Alcohol abuse and dependence can have a negative effect on your life. Drinking too much or too often can lead to addiction.  If you drink alcohol, limit how much you use.  If you are having trouble keeping your drinking under control, find ways to change your behavior. Hobbies, calming activities, exercise, or support groups can help.  If you feel you need help with changing your drinking habits, talk with your health care provider, a good friend, or a therapist, or go to an AA group. This information is not intended to replace advice given to you by your health care provider. Make sure you discuss any questions you have with your health care provider. Document Revised: 09/05/2018 Document Reviewed: 07/25/2018 Elsevier Patient Education  2020 Elsevier Inc.     Atrial Fibrillation  Atrial fibrillation is a type of heartbeat that is irregular or fast. If you have this condition, your heart beats without any order. This makes it hard for your heart  to pump blood in a normal way. Atrial fibrillation may come and go, or it may become a long-lasting problem. If this condition is not treated, it can put you at higher risk for stroke, heart failure, and other heart problems. What are the causes? This condition may be caused by diseases that damage the heart. They include:  High blood pressure.  Heart failure.  Heart valve disease.  Heart surgery. Other causes include:  Diabetes.  Thyroid disease.  Being overweight.  Kidney disease. Sometimes the cause is not known. What increases the risk? You are more likely to develop this condition if:  You are older.  You smoke.  You exercise often and very hard.  You have a family history of this condition.  You are a man.  You use drugs.  You drink a lot of alcohol.  You have lung conditions, such as emphysema, pneumonia, or COPD.  You have sleep apnea. What  are the signs or symptoms? Common symptoms of this condition include:  A feeling that your heart is beating very fast.  Chest pain or discomfort.  Feeling short of breath.  Suddenly feeling light-headed or weak.  Getting tired easily during activity.  Fainting.  Sweating. In some cases, there are no symptoms. How is this treated? Treatment for this condition depends on underlying conditions and how you feel when you have atrial fibrillation. They include:  Medicines to: ? Prevent blood clots. ? Treat heart rate or heart rhythm problems.  Using devices, such as a pacemaker, to correct heart rhythm problems.  Doing surgery to remove the part of the heart that sends bad signals.  Closing an area where clots can form in the heart (left atrial appendage). In some cases, your doctor will treat other underlying conditions. Follow these instructions at home: Medicines  Take over-the-counter and prescription medicines only as told by your doctor.  Do not take any new medicines without first talking to your doctor.  If you are taking blood thinners: ? Talk with your doctor before you take any medicines that have aspirin or NSAIDs, such as ibuprofen, in them. ? Take your medicine exactly as told by your doctor. Take it at the same time each day. ? Avoid activities that could hurt or bruise you. Follow instructions about how to prevent falls. ? Wear a bracelet that says you are taking blood thinners. Or, carry a card that lists what medicines you take. Lifestyle      Do not use any products that have nicotine or tobacco in them. These include cigarettes, e-cigarettes, and chewing tobacco. If you need help quitting, ask your doctor.  Eat heart-healthy foods. Talk with your doctor about the right eating plan for you.  Exercise regularly as told by your doctor.  Do not drink alcohol.  Lose weight if you are overweight.  Do not use drugs, including cannabis. General  instructions  If you have a condition that causes breathing to stop for a short period of time (apnea), treat it as told by your doctor.  Keep a healthy weight. Do not use diet pills unless your doctor says they are safe for you. Diet pills may make heart problems worse.  Keep all follow-up visits as told by your doctor. This is important. Contact a doctor if:  You notice a change in the speed, rhythm, or strength of your heartbeat.  You are taking a blood-thinning medicine and you get more bruising.  You get tired more easily when you move or exercise.  You have a sudden change in weight. Get help right away if:   You have pain in your chest or your belly (abdomen).  You have trouble breathing.  You have side effects of blood thinners, such as blood in your vomit, poop (stool), or pee (urine), or bleeding that cannot stop.  You have any signs of a stroke. "BE FAST" is an easy way to remember the main warning signs: ? B - Balance. Signs are dizziness, sudden trouble walking, or loss of balance. ? E - Eyes. Signs are trouble seeing or a change in how you see. ? F - Face. Signs are sudden weakness or loss of feeling in the face, or the face or eyelid drooping on one side. ? A - Arms. Signs are weakness or loss of feeling in an arm. This happens suddenly and usually on one side of the body. ? S - Speech. Signs are sudden trouble speaking, slurred speech, or trouble understanding what people say. ? T - Time. Time to call emergency services. Write down what time symptoms started.  You have other signs of a stroke, such as: ? A sudden, very bad headache with no known cause. ? Feeling like you may vomit (nausea). ? Vomiting. ? A seizure. These symptoms may be an emergency. Do not wait to see if the symptoms will go away. Get medical help right away. Call your local emergency services (911 in the [place] Do not drive yourself to the hospital. Summary  Atrial fibrillation is a type of  heartbeat that is irregular or fast.  You are at higher risk of this condition if you smoke, are older, have diabetes, or are overweight.  Follow your doctor's instructions about medicines, diet, exercise, and follow-up visits.  Get help right away if you have signs or symptoms of a stroke.  Get help right away if you cannot catch your breath, or you have chest pain or discomfort. This information is not intended to replace advice given to you by your health care provider. Make sure you discuss any questions you have with your health care provider. Document Revised: 11/08/2018 Document Reviewed: 11/08/2018 Elsevier Patient Education  2020 [employer].

## 2020-02-14 ENCOUNTER — Other Ambulatory Visit: Payer: Self-pay | Admitting: Internal Medicine

## 2020-02-14 DIAGNOSIS — R10814 Left lower quadrant abdominal tenderness: Secondary | ICD-10-CM

## 2020-02-29 ENCOUNTER — Ambulatory Visit
Admission: RE | Admit: 2020-02-29 | Discharge: 2020-02-29 | Disposition: A | Discharge status: Home / Self Care | Payer: BC Managed Care – PPO | Source: Ambulatory Visit | Attending: Internal Medicine | Admitting: Internal Medicine

## 2020-02-29 ENCOUNTER — Other Ambulatory Visit: Payer: Self-pay

## 2020-02-29 DIAGNOSIS — R10814 Left lower quadrant abdominal tenderness: Secondary | ICD-10-CM | POA: Diagnosis not present

## 2020-02-29 HISTORY — DX: Essential (primary) hypertension: I10

## 2020-02-29 HISTORY — DX: Type 2 diabetes mellitus without complications: E11.9

## 2020-02-29 MED ORDER — IOHEXOL 300 MG/ML  SOLN
100.0000 mL | Freq: Once | INTRAMUSCULAR | Status: AC | PRN
  Administered 2020-02-29: 100 mL via INTRAVENOUS

## 2020-03-23 ENCOUNTER — Other Ambulatory Visit: Payer: Self-pay

## 2020-03-23 ENCOUNTER — Emergency Department
Admission: EM | Admit: 2020-03-23 | Discharge: 2020-03-24 | Disposition: A | Discharge status: Home / Self Care | Payer: BC Managed Care – PPO | Attending: Emergency Medicine | Admitting: Emergency Medicine

## 2020-03-23 DIAGNOSIS — F1994 Other psychoactive substance use, unspecified with psychoactive substance-induced mood disorder: Secondary | ICD-10-CM

## 2020-03-23 DIAGNOSIS — E1165 Type 2 diabetes mellitus with hyperglycemia: Secondary | ICD-10-CM | POA: Diagnosis not present

## 2020-03-23 DIAGNOSIS — F1010 Alcohol abuse, uncomplicated: Secondary | ICD-10-CM | POA: Diagnosis present

## 2020-03-23 DIAGNOSIS — F4310 Post-traumatic stress disorder, unspecified: Secondary | ICD-10-CM

## 2020-03-23 DIAGNOSIS — J45909 Unspecified asthma, uncomplicated: Secondary | ICD-10-CM | POA: Insufficient documentation

## 2020-03-23 DIAGNOSIS — R45851 Suicidal ideations: Secondary | ICD-10-CM | POA: Diagnosis not present

## 2020-03-23 DIAGNOSIS — Z79899 Other long term (current) drug therapy: Secondary | ICD-10-CM | POA: Diagnosis not present

## 2020-03-23 DIAGNOSIS — F10129 Alcohol abuse with intoxication, unspecified: Secondary | ICD-10-CM | POA: Diagnosis not present

## 2020-03-23 DIAGNOSIS — Z7984 Long term (current) use of oral hypoglycemic drugs: Secondary | ICD-10-CM | POA: Diagnosis not present

## 2020-03-23 DIAGNOSIS — I10 Essential (primary) hypertension: Secondary | ICD-10-CM | POA: Diagnosis not present

## 2020-03-23 DIAGNOSIS — F321 Major depressive disorder, single episode, moderate: Secondary | ICD-10-CM | POA: Insufficient documentation

## 2020-03-23 DIAGNOSIS — Z20822 Contact with and (suspected) exposure to covid-19: Secondary | ICD-10-CM | POA: Diagnosis not present

## 2020-03-23 DIAGNOSIS — Z7982 Long term (current) use of aspirin: Secondary | ICD-10-CM | POA: Diagnosis not present

## 2020-03-23 DIAGNOSIS — F10920 Alcohol use, unspecified with intoxication, uncomplicated: Secondary | ICD-10-CM

## 2020-03-23 LAB — CBC
HCT: 51.4 % (ref 39.0–52.0)
Hemoglobin: 15.7 g/dL (ref 13.0–17.0)
MCH: 22.5 pg — ABNORMAL LOW (ref 26.0–34.0)
MCHC: 30.5 g/dL (ref 30.0–36.0)
MCV: 73.5 fL — ABNORMAL LOW (ref 80.0–100.0)
Platelets: 271 10*3/uL (ref 150–400)
RBC: 6.99 MIL/uL — ABNORMAL HIGH (ref 4.22–5.81)
RDW: 21.8 % — ABNORMAL HIGH (ref 11.5–15.5)
WBC: 9.5 10*3/uL (ref 4.0–10.5)
nRBC: 0 % (ref 0.0–0.2)

## 2020-03-23 LAB — COMPREHENSIVE METABOLIC PANEL
ALT: 71 U/L — ABNORMAL HIGH (ref 0–44)
AST: 46 U/L — ABNORMAL HIGH (ref 15–41)
Albumin: 3.9 g/dL (ref 3.5–5.0)
Alkaline Phosphatase: 58 U/L (ref 38–126)
Anion gap: 11 (ref 5–15)
BUN: <5 mg/dL — ABNORMAL LOW (ref 6–20)
CO2: 23 mmol/L (ref 22–32)
Calcium: 8.1 mg/dL — ABNORMAL LOW (ref 8.9–10.3)
Chloride: 96 mmol/L — ABNORMAL LOW (ref 98–111)
Creatinine, Ser: 0.7 mg/dL (ref 0.61–1.24)
GFR, Estimated: >60 mL/min (ref >60)
Glucose, Bld: 129 mg/dL — ABNORMAL HIGH (ref 70–99)
Potassium: 4 mmol/L (ref 3.5–5.1)
Sodium: 130 mmol/L — ABNORMAL LOW (ref 135–145)
Total Bilirubin: 0.6 mg/dL (ref 0.3–1.2)
Total Protein: 7.6 g/dL (ref 6.5–8.1)

## 2020-03-23 LAB — URINE DRUG SCREEN, QUALITATIVE (ARMC ONLY)
Amphetamines, Ur Screen: NOT DETECTED
Barbiturates, Ur Screen: NOT DETECTED
Benzodiazepine, Ur Scrn: NOT DETECTED
Cannabinoid 50 Ng, Ur ~~LOC~~: NOT DETECTED
Cocaine Metabolite,Ur ~~LOC~~: NOT DETECTED
MDMA (Ecstasy)Ur Screen: NOT DETECTED
Methadone Scn, Ur: NOT DETECTED
Opiate, Ur Screen: NOT DETECTED
Phencyclidine (PCP) Ur S: NOT DETECTED
Tricyclic, Ur Screen: NOT DETECTED

## 2020-03-23 LAB — ETHANOL: Alcohol, Ethyl (B): 285 mg/dL — ABNORMAL HIGH (ref <10)

## 2020-03-23 LAB — ACETAMINOPHEN LEVEL: Acetaminophen (Tylenol), Serum: <10 ug/mL — ABNORMAL LOW (ref 10–30)

## 2020-03-23 LAB — SALICYLATE LEVEL: Salicylate Lvl: <7 mg/dL — ABNORMAL LOW (ref 7.0–30.0)

## 2020-03-23 MED ORDER — LORAZEPAM 2 MG PO TABS
0.0000 mg | ORAL_TABLET | Freq: Four times a day (QID) | ORAL | Status: DC
  Administered 2020-03-24: 1 mg via ORAL
  Filled 2020-03-23: qty 1

## 2020-03-23 MED ORDER — THIAMINE HCL 100 MG PO TABS
100.0000 mg | ORAL_TABLET | Freq: Every day | ORAL | Status: DC
  Administered 2020-03-24: 100 mg via ORAL
  Filled 2020-03-23: qty 1
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
## 2020-03-23 NOTE — ED Notes (Signed)
Pt dressing out into hospital appropriate clothes by this RN and officer in room.  Belongings placed into 1 belongings bag.  Contents include: 1 pair gray ear rings, 1 gray necklace, 1 pair camo crocs, 1 blue t shirt, 1 black mesh shorts, 1 gray ring, 1 cell phone, 1 pair blue boxers briefs, 1 pair black socks.

## 2020-03-24 DIAGNOSIS — F1010 Alcohol abuse, uncomplicated: Secondary | ICD-10-CM

## 2020-03-24 DIAGNOSIS — F1994 Other psychoactive substance use, unspecified with psychoactive substance-induced mood disorder: Secondary | ICD-10-CM

## 2020-03-24 DIAGNOSIS — F4310 Post-traumatic stress disorder, unspecified: Secondary | ICD-10-CM

## 2020-03-24 LAB — RESPIRATORY PANEL BY RT PCR (FLU A&B, COVID)
Influenza A by PCR: NEGATIVE
Influenza B by PCR: NEGATIVE
SARS Coronavirus 2 by RT PCR: NEGATIVE

## 2020-03-24 MED ORDER — ALBUTEROL SULFATE (2.5 MG/3ML) 0.083% IN NEBU: 2.5000 mg | INHALATION_SOLUTION | RESPIRATORY_TRACT | Status: DC | PRN

## 2020-03-24 MED ORDER — LORATADINE 10 MG PO TABS: 10.0000 mg | ORAL_TABLET | Freq: Every evening | ORAL | Status: DC

## 2020-03-24 MED ORDER — BUMETANIDE 1 MG PO TABS
1.0000 mg | ORAL_TABLET | Freq: Two times a day (BID) | ORAL | Status: DC
  Administered 2020-03-24: 1 mg via ORAL
  Filled 2020-03-24 (×2): qty 1

## 2020-03-24 MED ORDER — ASPIRIN EC 81 MG PO TBEC
81.0000 mg | DELAYED_RELEASE_TABLET | Freq: Every day | ORAL | Status: DC
  Administered 2020-03-24: 81 mg via ORAL
  Filled 2020-03-24: qty 1

## 2020-03-24 MED ORDER — FERROUS SULFATE 325 (65 FE) MG PO TABS
325.0000 mg | ORAL_TABLET | Freq: Every day | ORAL | Status: DC
  Administered 2020-03-24: 325 mg via ORAL
  Filled 2020-03-24: qty 1

## 2020-03-24 MED ORDER — METOPROLOL SUCCINATE ER 50 MG PO TB24
50.0000 mg | ORAL_TABLET | Freq: Every day | ORAL | Status: DC
  Administered 2020-03-24: 50 mg via ORAL
  Filled 2020-03-24: qty 1

## 2020-03-24 MED ORDER — PANTOPRAZOLE SODIUM 40 MG PO TBEC
40.0000 mg | DELAYED_RELEASE_TABLET | Freq: Two times a day (BID) | ORAL | Status: DC
  Administered 2020-03-24: 40 mg via ORAL
  Filled 2020-03-24: qty 1

## 2020-03-24 NOTE — ED Notes (Signed)
Pt requested phone in am to call out of work
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
## 2020-03-24 NOTE — ED Provider Notes (Signed)
-----------------------------------------   10:36 AM on 03/24/2020 -----------------------------------------  Patient has been seen and evaluated by psychiatry.  They believe the patient safe for discharge home from psychiatric standpoint.  Patient's medical work-up has been overall nonrevealing besides an elevated alcohol level upon arrival.  Patient will be discharged home with outpatient resources.   Minna Antis, MD 03/24/20 1036

## 2020-03-24 NOTE — Discharge Instructions (Addendum)
You have been seen in the emergency department for a  psychiatric concern. You have been evaluated both medically as well as psychiatrically. Please follow-up with your outpatient resources provided. Return to the emergency department for any worsening symptoms, or any thoughts of hurting yourself or anyone else so that we may attempt to help you. 
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
## 2020-03-24 NOTE — ED Notes (Signed)
Patient evaluated by psych and discharged to home  

## 2020-03-24 NOTE — ED Notes (Signed)
Patient out of bed to bathroom, requesting phone and wanting to know when is he getting out of here, reports needs to go to work.

## 2020-05-31 DEATH — deceased

## 2020-11-02 IMAGING — CT CT ABD-PELV W/ CM
2 of 5 series · 16 of 46 positions shown, 18 images · IV contrast (omnipaque)
Comparison: None.

CLINICAL DATA: Left abdominal pain, knot for 1 month

EXAM:
CT ABDOMEN AND PELVIS WITH CONTRAST
TECHNIQUE: Multidetector CT imaging of the abdomen and pelvis was performed
using the standard protocol following bolus administration of
intravenous contrast.
CONTRAST:  100mL OMNIPAQUE IOHEXOL 300 MG/ML  SOLN

[Series 2: abd pelvis 5.00 · axial · 0.81mm/px · z∈[-1575,-1125]mm · 13 of 102 slices shown, 15 images]
[im 6/102  soft-tissue]
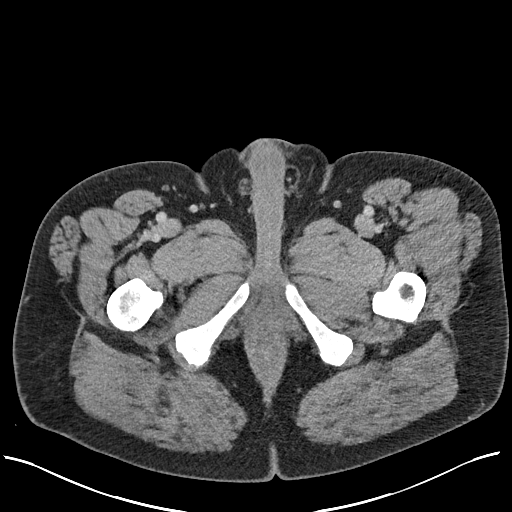
[im 6/102  bone]
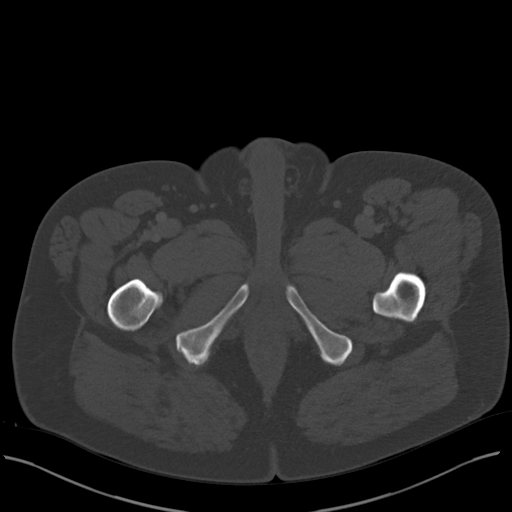
[im 12/102  soft-tissue]
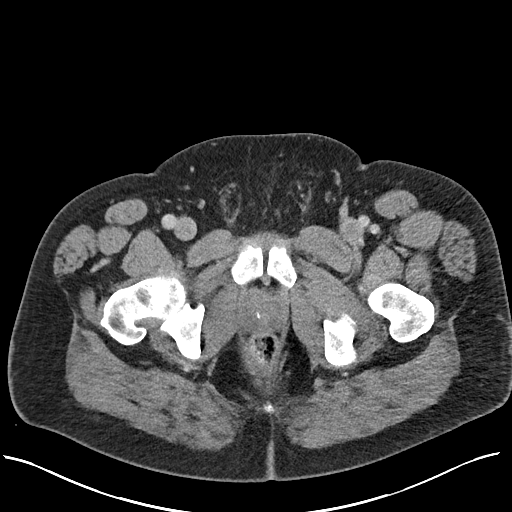
[im 23/102  soft-tissue]
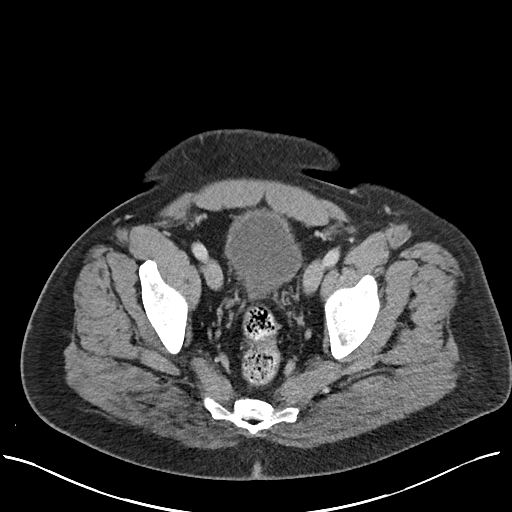
[im 29/102  soft-tissue]
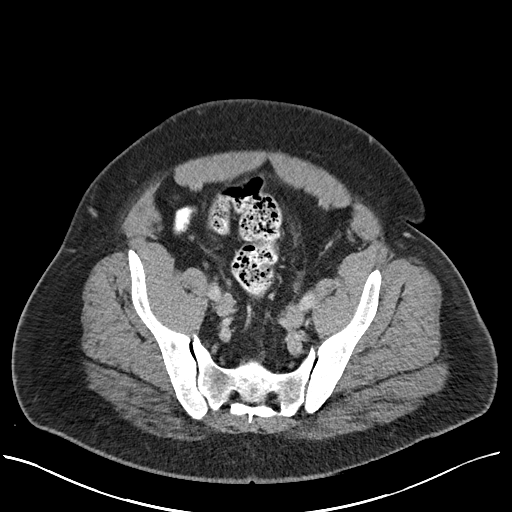
[im 34/102  soft-tissue]
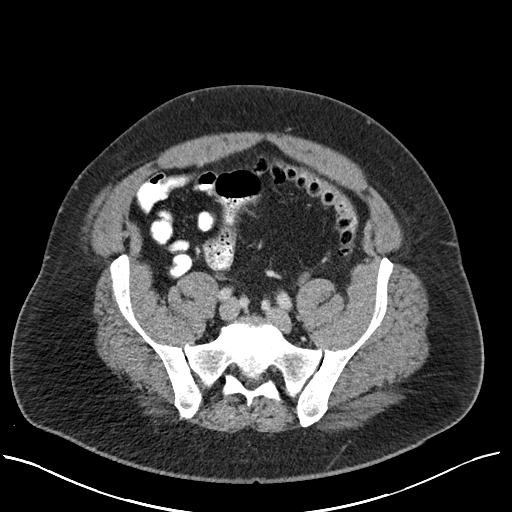
[im 45/102  soft-tissue]
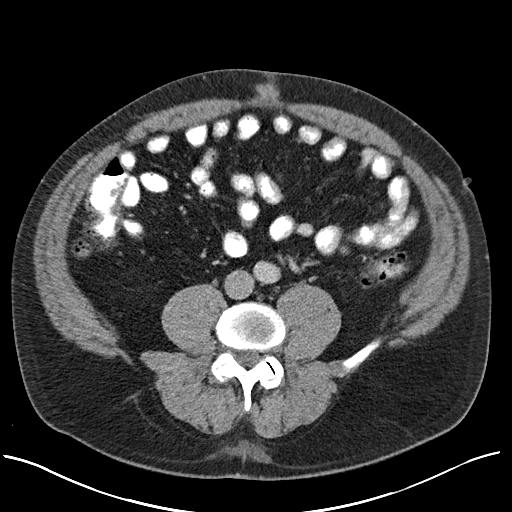
[im 51/102  soft-tissue]
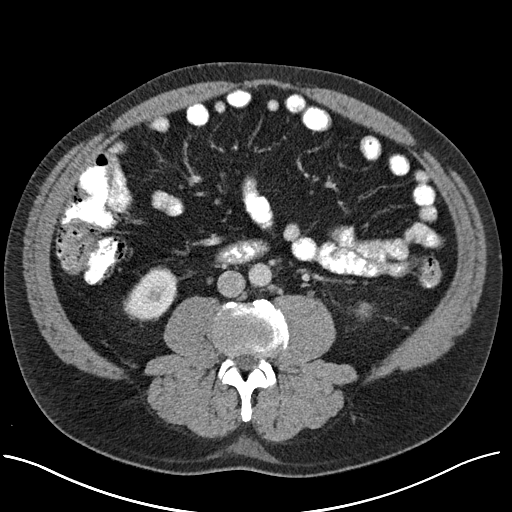
[im 57/102  soft-tissue]
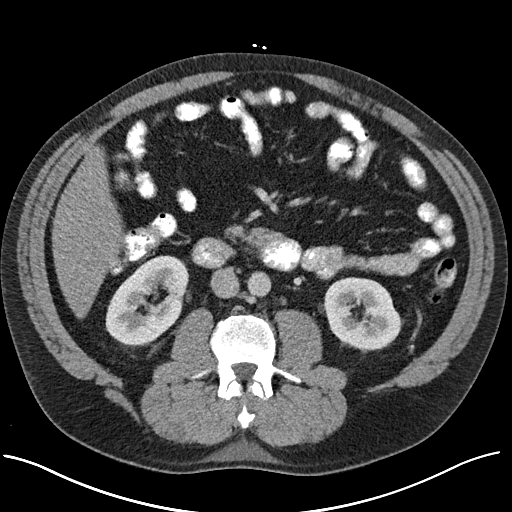
[im 68/102  soft-tissue]
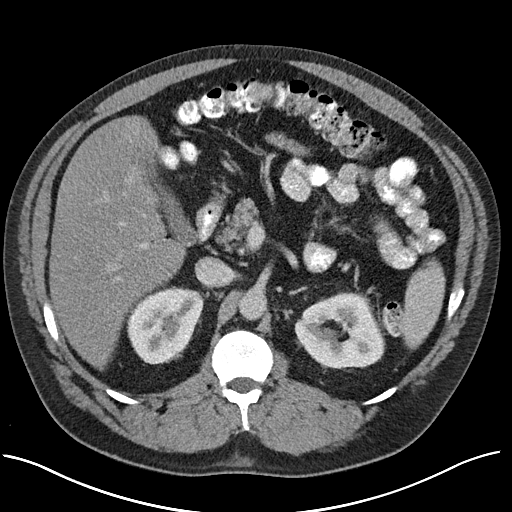
[im 68/102  bone]
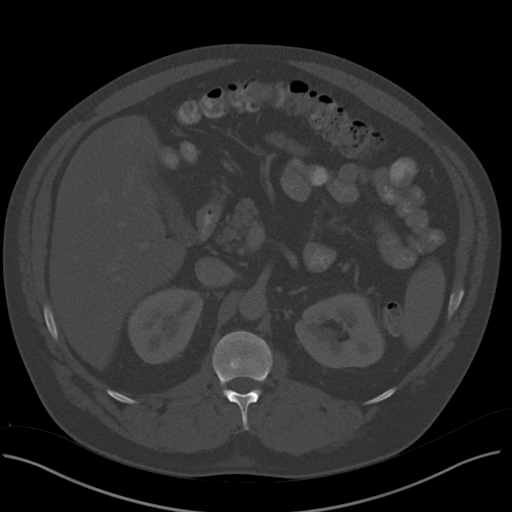
[im 73/102  soft-tissue]
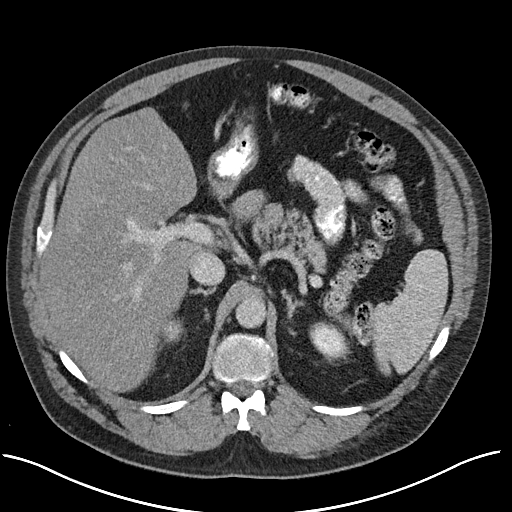
[im 79/102  soft-tissue]
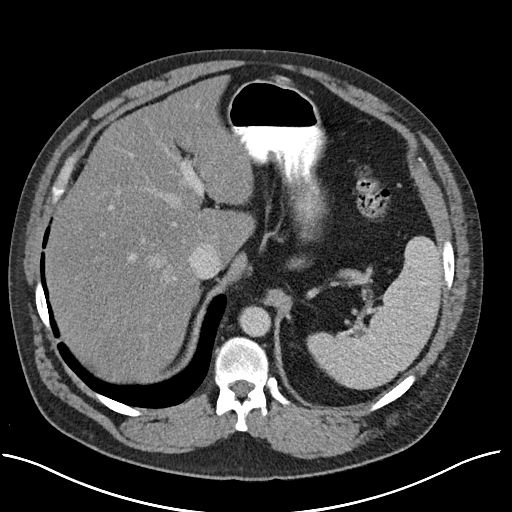
[im 90/102  soft-tissue]
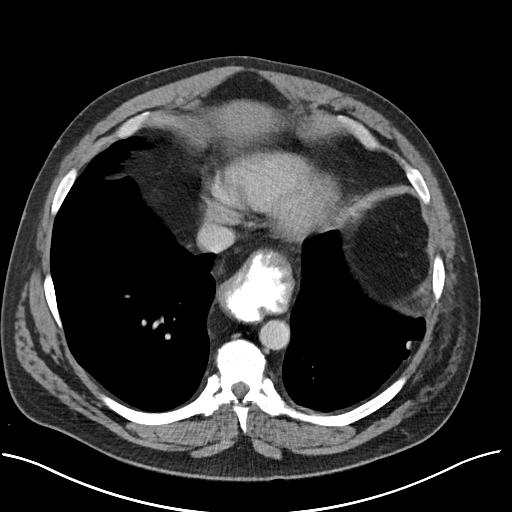
[im 96/102  soft-tissue]
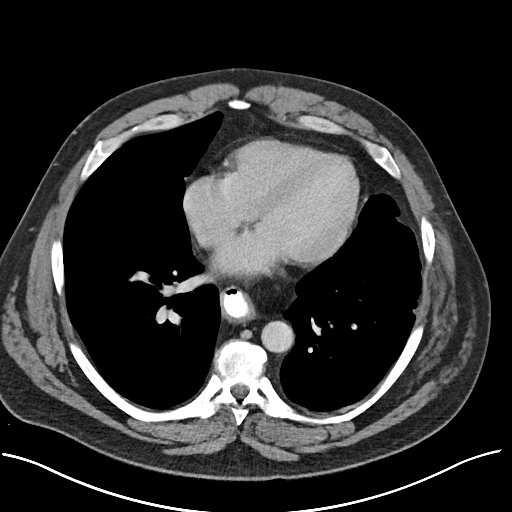

[Series 4: coronals abd pelvis 2.00 cor · coronal · 0.81mm/px · 3 of 180 slices shown]
[im 60/180  soft-tissue]
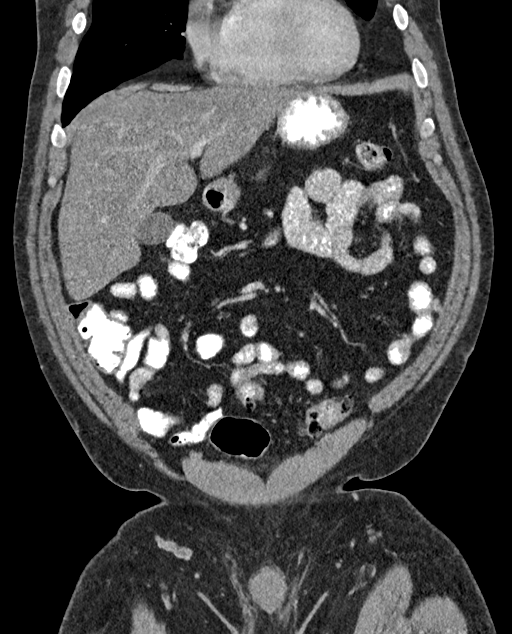
[im 80/180  soft-tissue]
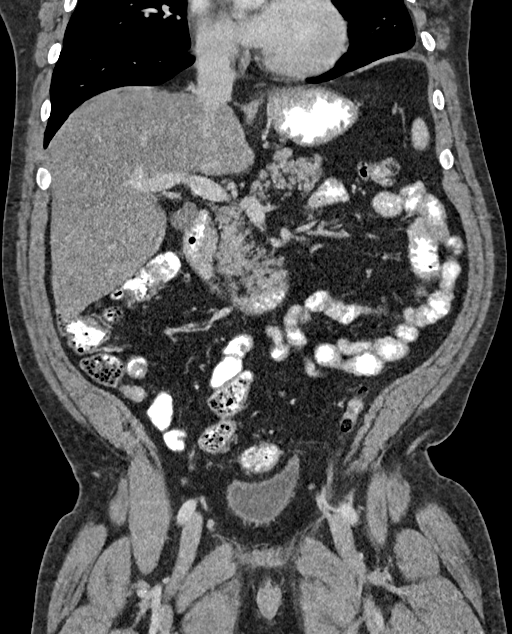
[im 100/180  soft-tissue]
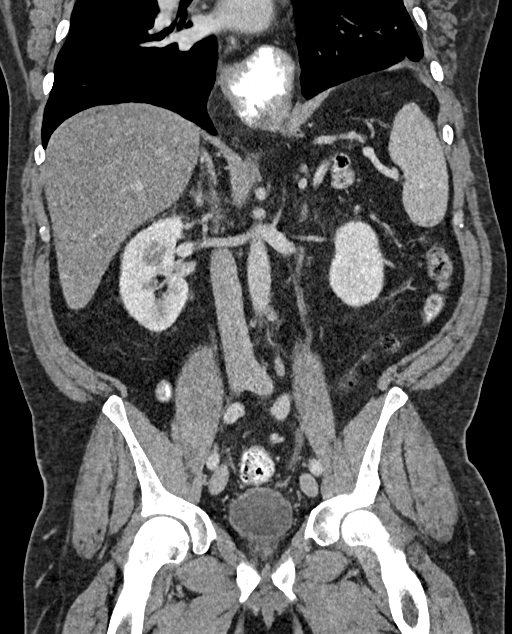

[16 of 46 positions shown; findings below may reference images not displayed]

FINDINGS: Lower chest: Scarring at the left lung base. No acute abnormality.
Moderate-sized hiatal hernia.

Hepatobiliary: Diffuse low-density throughout the liver compatible
with fatty infiltration. No focal abnormality. Gallbladder
unremarkable.

Pancreas: No focal abnormality or ductal dilatation.

Spleen: No focal abnormality.  Normal size.

Adrenals/Urinary Tract: No adrenal abnormality. No focal renal
abnormality. No stones or hydronephrosis. Urinary bladder is
unremarkable.

Stomach/Bowel: Descending colonic and sigmoid diverticulosis. Slight
stranding around the distal descending colon may reflect early
active diverticulitis. Stomach and small bowel decompressed,
unremarkable.

Vascular/Lymphatic: No evidence of aneurysm or adenopathy.

Reproductive: No visible focal abnormality.

Other: No free fluid or free air. No mass or hernia visualized in
the left abdomen.

Musculoskeletal: No acute bony abnormality.
IMPRESSION: Left colonic diverticulosis. Slight stranding around the distal
descending colon may reflect early/mild active diverticulitis.

Diffuse fatty infiltration of the liver.

Moderate-sized hiatal hernia.
